# Patient Record
Sex: Female | Born: 1960 | Race: Black or African American | Hispanic: No | Marital: Single | Smoking: Never smoker
Health system: Southern US, Community
[De-identification: ages and names within clinical notes are randomized; demographics above are authoritative.]

## PROBLEM LIST (undated history)

## (undated) DIAGNOSIS — E288 Other ovarian dysfunction: Secondary | ICD-10-CM

## (undated) HISTORY — DX: Other ovarian dysfunction: E28.8

## (undated) HISTORY — PX: WISDOM TOOTH EXTRACTION: SHX21

---

## 2003-09-12 DIAGNOSIS — E2839 Other primary ovarian failure: Secondary | ICD-10-CM

## 2003-09-12 HISTORY — DX: Other primary ovarian failure: E28.39

## 2004-02-09 ENCOUNTER — Other Ambulatory Visit: Admission: RE | Admit: 2004-02-09 | Discharge: 2004-02-09 | Payer: Self-pay | Admitting: Obstetrics and Gynecology

## 2005-03-07 ENCOUNTER — Other Ambulatory Visit: Admission: RE | Admit: 2005-03-07 | Discharge: 2005-03-07 | Payer: Self-pay | Admitting: Obstetrics and Gynecology

## 2006-03-08 ENCOUNTER — Other Ambulatory Visit: Admission: RE | Admit: 2006-03-08 | Discharge: 2006-03-08 | Payer: Self-pay | Admitting: Obstetrics and Gynecology

## 2007-03-11 ENCOUNTER — Other Ambulatory Visit: Admission: RE | Admit: 2007-03-11 | Discharge: 2007-03-11 | Payer: Self-pay | Admitting: Obstetrics and Gynecology

## 2007-03-13 ENCOUNTER — Ambulatory Visit: Payer: Self-pay

## 2008-03-12 ENCOUNTER — Other Ambulatory Visit: Admission: RE | Admit: 2008-03-12 | Discharge: 2008-03-12 | Payer: Self-pay | Admitting: Obstetrics & Gynecology

## 2008-03-19 ENCOUNTER — Ambulatory Visit: Payer: Self-pay | Admitting: Obstetrics and Gynecology

## 2008-09-24 DIAGNOSIS — E010 Iodine-deficiency related diffuse (endemic) goiter: Secondary | ICD-10-CM | POA: Insufficient documentation

## 2008-09-24 DIAGNOSIS — E559 Vitamin D deficiency, unspecified: Secondary | ICD-10-CM | POA: Insufficient documentation

## 2008-09-24 DIAGNOSIS — I459 Conduction disorder, unspecified: Secondary | ICD-10-CM | POA: Insufficient documentation

## 2009-04-01 ENCOUNTER — Ambulatory Visit: Payer: Self-pay | Admitting: Obstetrics and Gynecology

## 2009-04-06 ENCOUNTER — Ambulatory Visit: Payer: Self-pay | Admitting: Obstetrics and Gynecology

## 2010-05-18 ENCOUNTER — Ambulatory Visit: Payer: Self-pay | Admitting: Obstetrics and Gynecology

## 2011-06-30 ENCOUNTER — Ambulatory Visit: Payer: Self-pay | Admitting: Obstetrics and Gynecology

## 2012-07-11 ENCOUNTER — Ambulatory Visit: Payer: Self-pay | Admitting: Obstetrics and Gynecology

## 2013-05-13 ENCOUNTER — Encounter: Payer: Self-pay | Admitting: Nurse Practitioner

## 2013-05-13 ENCOUNTER — Ambulatory Visit (INDEPENDENT_AMBULATORY_CARE_PROVIDER_SITE_OTHER): Payer: BC Managed Care – PPO | Admitting: Nurse Practitioner

## 2013-05-13 VITALS — BP 130/84 | HR 56 | Resp 16 | Ht 65.75 in | Wt 165.0 lb

## 2013-05-13 DIAGNOSIS — Z0000 Encounter for general adult medical examination without abnormal findings: Secondary | ICD-10-CM

## 2013-05-13 DIAGNOSIS — Z01419 Encounter for gynecological examination (general) (routine) without abnormal findings: Secondary | ICD-10-CM

## 2013-05-13 LAB — LIPID PANEL
Cholesterol: 173 mg/dL (ref 0–200)
HDL: 72 mg/dL (ref >39)
LDL Cholesterol: 92 mg/dL (ref 0–99)
Total CHOL/HDL Ratio: 2.4 Ratio
Triglycerides: 44 mg/dL (ref <150)
VLDL: 9 mg/dL (ref 0–40)

## 2013-05-13 LAB — POCT URINALYSIS DIPSTICK
Bilirubin, UA: NEGATIVE
Glucose, UA: NEGATIVE
Ketones, UA: NEGATIVE
Leukocytes, UA: NEGATIVE
Nitrite, UA: NEGATIVE
Protein, UA: NEGATIVE
Urobilinogen, UA: NEGATIVE
pH, UA: 7

## 2013-05-13 LAB — COMPREHENSIVE METABOLIC PANEL
ALT: 16 U/L (ref 0–35)
AST: 21 U/L (ref 0–37)
Albumin: 4.6 g/dL (ref 3.5–5.2)
Alkaline Phosphatase: 67 U/L (ref 39–117)
BUN: 18 mg/dL (ref 6–23)
CO2: 28 mEq/L (ref 19–32)
Calcium: 9.7 mg/dL (ref 8.4–10.5)
Chloride: 107 mEq/L (ref 96–112)
Creat: 0.84 mg/dL (ref 0.50–1.10)
Glucose, Bld: 86 mg/dL (ref 70–99)
Potassium: 4.5 mEq/L (ref 3.5–5.3)
Sodium: 143 mEq/L (ref 135–145)
Total Bilirubin: 0.5 mg/dL (ref 0.3–1.2)
Total Protein: 6.9 g/dL (ref 6.0–8.3)

## 2013-05-13 LAB — TSH: TSH: 0.97 u[IU]/mL (ref 0.350–4.500)

## 2013-05-13 LAB — HEMOGLOBIN, FINGERSTICK: Hemoglobin, fingerstick: 13.6 g/dL (ref 12.0–16.0)

## 2013-05-13 MED ORDER — ERGOCALCIFEROL 1.25 MG (50000 UT) PO CAPS: 50000.0000 [IU] | ORAL_CAPSULE | ORAL | Status: DC

## 2013-05-13 NOTE — Patient Instructions (Signed)

## 2013-05-13 NOTE — Progress Notes (Signed)
Patient ID: Emily Roth, female   DOB: 1961/02/01, 52 y.o.   MRN: [id number] 52 y.o. .G1, P1 Divorced African American Fe here for annual exam.   Occasional vaso symptoms. Not sexually active. No new health problems. She normally goes on Missions trip in the summer with the teens, she did not go this summer.  No LMP recorded. Patient is postmenopausal.          Sexually active: no  The current method of family planning is abstinence.    Exercising: yes  walking, running,weights Smoker:  no  Health Maintenance: Pap:  05/07/12, WNL, neg HR HPV MMG:  07/11/12, BI-Rads 2: benign findings Colonoscopy:  10/09/11 polyp recheck in 5 -10 years BMD: 04/01/09 T Score: spine -1.3/ left hip neck -0.5/ radius -0.7 TDaP:  05/09/13 Labs: HB: 13.6 Urine: trace blood, pH 7.0   reports that she has never smoked. She has never used smokeless tobacco. She reports that she does not drink alcohol or use illicit drugs.  Past Medical History  Diagnosis Date  . Premature ovarian failure     History reviewed. No pertinent past surgical history.  Current Outpatient Prescriptions  Medication Sig Dispense Refill  . CALCIUM PO Take by mouth.      . ergocalciferol (VITAMIN D2) 50000 UNITS capsule Take 50,000 Units by mouth every 30 (thirty) days.      . Multiple Vitamin (MULTIVITAMIN) tablet Take 1 tablet by mouth daily.      . Multiple Vitamins-Minerals (HAIR/SKIN/NAILS PO) Take by mouth.      . Omega-3 Fatty Acids (FISH OIL PO) Take by mouth.       No current facility-administered medications for this visit.    Family History  Problem Relation Age of Onset  . Other Sister     [place] Syndrome  . Other Brother     [place] Syndrome  . Other Brother     [place] Syndrome    ROS:  Pertinent items are noted in HPI.  Otherwise, a comprehensive ROS was negative.  Exam:   BP 130/84  Pulse 56  Resp 16  Ht 5' 5.75" (1.67 m)  Wt 165 lb (74.844 kg)  BMI 26.84 kg/m2 Height: 5' 5.75" (167 cm)  Ht  Readings from Last 3 Encounters:  05/13/13 5' 5.75" (1.67 m)    General appearance: alert, cooperative and appears stated age Head: Normocephalic, without obvious abnormality, atraumatic Neck: no adenopathy, supple, symmetrical, trachea midline and thyroid normal to inspection and palpation Lungs: clear to auscultation bilaterally Breasts: normal appearance, no masses or tenderness Heart: regular rate and rhythm Abdomen: soft, non-tender; no masses,  no organomegaly Extremities: extremities normal, atraumatic, no cyanosis or edema Skin: Skin color, texture, turgor normal. No rashes or lesions Lymph nodes: Cervical, supraclavicular, and axillary nodes normal. No abnormal inguinal nodes palpated Neurologic: Grossly normal   Pelvic: External genitalia:  no lesions              Urethra:  normal appearing urethra with no masses, tenderness or lesions              Bartholin's and Skene's: normal                 Vagina: normal appearing vagina with normal color and discharge, no lesions              Cervix: anteverted              Pap taken: no Bimanual Exam:  Uterus:  normal size, contour, position, consistency, mobility, non-tender              Adnexa: no mass, fullness, tenderness               Rectovaginal: Confirms               Anus:  normal sphincter tone, no lesions  A:  Well Woman with normal exam  History of POF age 55 - no HRT  Vit D deficiency  P:   Pap smear as per guidelines   Mammogram due after 07/12/13 note given for [facility]  Refill Vitamin D - instructions per lab results  Counseled on breast self exam, adequate intake of calcium and vitamin D, diet and exercise return annually or prn  An After Visit Summary was printed and given to the patient.

## 2013-05-14 LAB — VITAMIN D 25 HYDROXY (VIT D DEFICIENCY, FRACTURES): Vit D, 25-Hydroxy: 53 ng/mL (ref 30–89)

## 2013-05-17 NOTE — Progress Notes (Signed)
Encounter reviewed by Dr. Conley Simmonds.  Consider bone density this year or next year due to osteopenia and premature ovarian failure.

## 2013-05-27 ENCOUNTER — Other Ambulatory Visit: Payer: Self-pay | Admitting: Nurse Practitioner

## 2013-07-14 ENCOUNTER — Ambulatory Visit: Payer: Self-pay | Admitting: Obstetrics and Gynecology

## 2014-05-19 ENCOUNTER — Ambulatory Visit (INDEPENDENT_AMBULATORY_CARE_PROVIDER_SITE_OTHER): Payer: BC Managed Care – PPO | Admitting: Nurse Practitioner

## 2014-05-19 ENCOUNTER — Encounter: Payer: Self-pay | Admitting: Nurse Practitioner

## 2014-05-19 VITALS — BP 120/66 | HR 68 | Ht 66.0 in | Wt 162.0 lb

## 2014-05-19 DIAGNOSIS — E288 Other ovarian dysfunction: Secondary | ICD-10-CM

## 2014-05-19 DIAGNOSIS — Z01419 Encounter for gynecological examination (general) (routine) without abnormal findings: Secondary | ICD-10-CM

## 2014-05-19 DIAGNOSIS — N952 Postmenopausal atrophic vaginitis: Secondary | ICD-10-CM | POA: Insufficient documentation

## 2014-05-19 DIAGNOSIS — Z0000 Encounter for general adult medical examination without abnormal findings: Secondary | ICD-10-CM

## 2014-05-19 DIAGNOSIS — R319 Hematuria, unspecified: Secondary | ICD-10-CM

## 2014-05-19 DIAGNOSIS — E2839 Other primary ovarian failure: Secondary | ICD-10-CM | POA: Insufficient documentation

## 2014-05-19 LAB — POCT URINALYSIS DIPSTICK
Bilirubin, UA: NEGATIVE
Glucose, UA: NEGATIVE
Ketones, UA: NEGATIVE
Leukocytes, UA: NEGATIVE
Nitrite, UA: NEGATIVE
Protein, UA: NEGATIVE
Urobilinogen, UA: NEGATIVE
pH, UA: 6

## 2014-05-19 NOTE — Patient Instructions (Signed)

## 2014-05-19 NOTE — Progress Notes (Signed)
Patient ID: Emily Roth, female   DOB: 1961-06-01, 53 y.o.   MRN: [id number] 53 y.o. G1P1001 Divorced African American Fe here for annual exam.   Before going on [place] trip went to PCP and had UTI.  That was treated with Cipro and symptoms now gone.  Went on [place] trip this [place]  Patient's last menstrual period was 01/10/2004.          Sexually active: no  The current method of family planning is abstinence and post menopausal.  Exercising: yes treadmill and weights  Smoker: no   Health Maintenance:  Pap: 05/07/12, WNL, neg HR HPV  MMG: 07/14/13, Bi-Rads 1:  Negative  Colonoscopy: 10/09/11 polyp recheck in 5 -10 years  BMD: 04/01/09 T Score: spine -1.3/ left hip neck -0.5/ radius -0.7  TDaP: 05/09/13  Labs:  HB:  PCP  Urine:  Small RBC   reports that she has never smoked. She has never used smokeless tobacco. She reports that she does not drink alcohol or use illicit drugs.  Past Medical History  Diagnosis Date  . Premature ovarian failure 2005    age 45    Past Surgical History  Procedure Laterality Date  . Wisdom tooth extraction  age 89    Current Outpatient Prescriptions  Medication Sig Dispense Refill  . CALCIUM PO Take by mouth.      . ergocalciferol (VITAMIN D2) 50000 UNITS capsule Take 1 capsule (50,000 Units total) by mouth every 30 (thirty) days.  30 capsule  1  . Multiple Vitamin (MULTIVITAMIN) tablet Take 1 tablet by mouth daily.      . Multiple Vitamins-Minerals (HAIR/SKIN/NAILS PO) Take by mouth.      . Omega-3 Fatty Acids (FISH OIL PO) Take by mouth.       No current facility-administered medications for this visit.    Family History  Problem Relation Age of Onset  . Other Sister     [place] Syndrome  . Other Brother     [place] Syndrome  . Other Brother     [place] Syndrome    ROS:  Pertinent items are noted in HPI.  Otherwise, a comprehensive ROS was negative.  Exam:   BP 120/66  Pulse 68  Ht  (1.676 m)  Wt 162 lb  (73.483 kg)  BMI 26.16 kg/m2  LMP 01/10/2004 Height:  (167.6 cm)  Ht Readings from Last 3 Encounters:  05/19/14  (1.676 m)  05/13/13 5' 5.75" (1.67 m)    General appearance: alert, cooperative and appears stated age Head: Normocephalic, without obvious abnormality, atraumatic Neck: no adenopathy, supple, symmetrical, trachea midline and thyroid normal to inspection and palpation Lungs: clear to auscultation bilaterally Breasts: normal appearance, no masses or tenderness Heart: regular rate and rhythm Abdomen: soft, non-tender; no masses,  no organomegaly Extremities: extremities normal, atraumatic, no cyanosis or edema Skin: Skin color, texture, turgor normal. No rashes or lesions Lymph nodes: Cervical, supraclavicular, and axillary nodes normal. No abnormal inguinal nodes palpated Neurologic: Grossly normal   Pelvic: External genitalia:  no lesions              Urethra:  normal appearing urethra with no masses, tenderness or lesions              Bartholin's and Skene's: normal                 Vagina: atrophic appearing vagina with pale color and discharge, no lesions  Cervix: anteverted              Pap taken: No. Bimanual Exam:  Uterus:  normal size, contour, position, consistency, mobility, non-tender              Adnexa: no mass, fullness, tenderness               Rectovaginal: Confirms               Anus:  normal sphincter tone, no lesions  A:  Well Woman with normal exam  History of POF with menopause at age 50  History of recent UTI - today hematuria asymptomatic  Atrophic vaginitis  P:   Reviewed health and wellness pertinent to exam  Pap smear not taken today  Mammogram is due 11/15  Will follow with urine micro and C&S  Hand written note for BMD and Mammo at [facility] on breast self exam, mammography screening, osteoporosis, adequate intake of calcium and vitamin D, diet and exercise, Kegel's exercises return annually or  prn  An After Visit Summary was printed and given to the patient.

## 2014-05-20 LAB — URINE CULTURE: Colony Count: 9000

## 2014-05-20 LAB — URINALYSIS, MICROSCOPIC ONLY
Bacteria, UA: NONE SEEN
Casts: NONE SEEN
Crystals: NONE SEEN
Squamous Epithelial / LPF: NONE SEEN

## 2014-05-24 NOTE — Progress Notes (Signed)
Encounter reviewed by Dr. Brook Silva.  

## 2014-07-13 ENCOUNTER — Encounter: Payer: Self-pay | Admitting: Nurse Practitioner

## 2014-07-15 ENCOUNTER — Ambulatory Visit: Payer: Self-pay | Admitting: Obstetrics and Gynecology

## 2014-07-28 ENCOUNTER — Ambulatory Visit: Payer: Self-pay | Admitting: Obstetrics and Gynecology

## 2014-07-30 ENCOUNTER — Telehealth: Payer: Self-pay | Admitting: Nurse Practitioner

## 2014-07-30 NOTE — Telephone Encounter (Signed)
Let patient know that BMD shows  The T Score of spine at -1.6; left neck femur at -0.8.  The spine scores are in the osteopenic range, hip is normal.  Comparison study to 2010 shows a decrease at spine of -5%.  The FRAX score for major osteoporotic fracture is 10 years is 2.2% (goal is < 20%); for hip fracture 0.1% (goal is <3%).  While some bone loss is normal and expectant we do not want any further loss.  She must do walking exercise, upper body weights, calcium, and Vit D.  Recheck in 2 years.

## 2014-07-31 NOTE — Telephone Encounter (Signed)
I have attempted to contact this patient by phone with the following results: left message to return my call on answering machine.  [phone] mobile per DPR.

## 2014-08-20 NOTE — Telephone Encounter (Signed)
RC from patient.  Notified of results.  She is agreeable with plan.

## 2014-10-20 ENCOUNTER — Telehealth: Payer: Self-pay | Admitting: Nurse Practitioner

## 2014-10-20 NOTE — Telephone Encounter (Signed)
Left message to call back and reschedule appointment.

## 2014-12-24 ENCOUNTER — Telehealth: Payer: Self-pay | Admitting: Nurse Practitioner

## 2014-12-24 NOTE — Telephone Encounter (Signed)
Left message on voicemail to reschedule aex appointment. °

## 2015-01-04 ENCOUNTER — Telehealth: Payer: Self-pay | Admitting: Nurse Practitioner

## 2015-01-04 NOTE — Telephone Encounter (Signed)
Left patient a message to call back to reschedule a future appointment that was cancelled by the provider. °

## 2015-04-21 ENCOUNTER — Other Ambulatory Visit: Payer: Self-pay | Admitting: Nurse Practitioner

## 2015-04-21 NOTE — Telephone Encounter (Signed)
Medication refill request: Vitamin D 50,000 iu's Last AEX:  05/19/14 with PG  Next AEX: 05/21/15 with PG  Last Vitamin D level checked: 05/13/13 at 53 Refill authorized: Please advise

## 2015-05-20 ENCOUNTER — Ambulatory Visit: Payer: Self-pay | Admitting: Nurse Practitioner

## 2015-05-21 ENCOUNTER — Ambulatory Visit (INDEPENDENT_AMBULATORY_CARE_PROVIDER_SITE_OTHER): Payer: BLUE CROSS/BLUE SHIELD | Admitting: Nurse Practitioner

## 2015-05-21 ENCOUNTER — Encounter: Payer: Self-pay | Admitting: Nurse Practitioner

## 2015-05-21 VITALS — BP 120/84 | HR 72 | Ht 65.75 in | Wt 165.0 lb

## 2015-05-21 DIAGNOSIS — E288 Other ovarian dysfunction: Secondary | ICD-10-CM | POA: Diagnosis not present

## 2015-05-21 DIAGNOSIS — Z0000 Encounter for general adult medical examination without abnormal findings: Secondary | ICD-10-CM | POA: Diagnosis not present

## 2015-05-21 DIAGNOSIS — N952 Postmenopausal atrophic vaginitis: Secondary | ICD-10-CM | POA: Diagnosis not present

## 2015-05-21 DIAGNOSIS — E2839 Other primary ovarian failure: Secondary | ICD-10-CM

## 2015-05-21 DIAGNOSIS — Z01419 Encounter for gynecological examination (general) (routine) without abnormal findings: Secondary | ICD-10-CM | POA: Diagnosis not present

## 2015-05-21 LAB — POCT URINALYSIS DIPSTICK
Bilirubin, UA: NEGATIVE
Glucose, UA: NEGATIVE
Ketones, UA: NEGATIVE
Nitrite, UA: NEGATIVE
Protein, UA: NEGATIVE
Urobilinogen, UA: NEGATIVE
pH, UA: 6

## 2015-05-21 NOTE — Progress Notes (Signed)
Patient ID: Emily Roth, female   DOB: 1961-05-02, 54 y.o.   MRN: [id number] 54 y.o. G1P1001 Married  African American Fe here for annual exam.  Usual vaso symptoms that are tolerable.  She has lost 3 other family members from Birdsong disease.  Niece age 7 and nephew age 83.  Later a MU with same plus suspect another disease maybe cancer.   Patient's last menstrual period was 01/10/2004 (approximate).          Sexually active: No.  The current method of family planning is none.    Exercising: Yes.    weights, treadmill and dance Smoker:  no  Health Maintenance: Pap: 05/07/12, Negative, neg HR HPV  MMG: 07/15/14, Bi-Rads 1: Negative Colonoscopy: 10/09/11 polyp recheck in 5 -10 years  BMD: 07/15/14 T Score: spine -1.6 / left hip neck -0.8 TDaP: 05/09/13  Labs: HB: 13.8  Urine:  Trace leuk's, small RBC   reports that she has never smoked. She has never used smokeless tobacco. She reports that she does not drink alcohol or use illicit drugs.  Past Medical History  Diagnosis Date  . Premature ovarian failure 2005    age 89    Past Surgical History  Procedure Laterality Date  . Wisdom tooth extraction  age 54    Current Outpatient Prescriptions  Medication Sig Dispense Refill  . CALCIUM PO Take by mouth.    . Multiple Vitamin (MULTIVITAMIN) tablet Take 1 tablet by mouth daily.    . Multiple Vitamins-Minerals (HAIR/SKIN/NAILS PO) Take by mouth.    . Omega-3 Fatty Acids (FISH OIL PO) Take by mouth.    . Vitamin D, Ergocalciferol, (DRISDOL) 50000 UNITS CAPS capsule TAKE ONE CAPSULE BY MOUTH ONCE PER MONTH 4 capsule 0   No current facility-administered medications for this visit.    Family History  Problem Relation Age of Onset  . Other Sister     [place] Syndrome  . Other Brother     [place] Syndrome  . Other Brother     [place] Syndrome  . Other Other     neice - [place]  . Other Other     nephew - [place]  . Other Maternal Uncle     ? cnacer and [place]    ROS:  Pertinent items are noted in HPI.  Otherwise, a comprehensive ROS was negative.  Exam:   BP 120/84 mmHg  Pulse 72  Ht 5' 5.75" (1.67 m)  Wt 165 lb (74.844 kg)  BMI 26.84 kg/m2  LMP 01/10/2004 (Approximate) Height: 5' 5.75" (167 cm) Ht Readings from Last 3 Encounters:  05/21/15 5' 5.75" (1.67 m)  05/19/14  (1.676 m)  05/13/13 5' 5.75" (1.67 m)    General appearance: alert, cooperative and appears stated age Head: Normocephalic, without obvious abnormality, atraumatic Neck: no adenopathy, supple, symmetrical, trachea midline and thyroid normal to inspection and palpation Lungs: clear to auscultation bilaterally Breasts: normal appearance, no masses or tenderness Heart: regular rate and rhythm Abdomen: soft, non-tender; no masses,  no organomegaly Extremities: extremities normal, atraumatic, no cyanosis or edema Skin: Skin color, texture, turgor normal. No rashes or lesions Lymph nodes: Cervical, supraclavicular, and axillary nodes normal. No abnormal inguinal nodes palpated Neurologic: Grossly normal   Pelvic: External genitalia:  no lesions              Urethra:  normal appearing urethra with no masses, tenderness or lesions  Bartholin's and Skene's: normal                 Vagina: normal appearing vagina with normal color and discharge, no lesions              Cervix: anteverted              Pap taken: Yes.   Bimanual Exam:  Uterus:  normal size, contour, position, consistency, mobility, non-tender              Adnexa: no mass, fullness, tenderness               Rectovaginal: Confirms               Anus:  normal sphincter tone, no lesions  Chaperone present: yes  A:  Well Woman with normal exam  History of POF with menopause at age 89 History of recent UTI - today hematuria asymptomatic Atrophic vaginitis  Family history of [place] Disease  P:   Reviewed health and wellness pertinent to exam  Pap smear as  above  Mammogram is due 11/15  Counseled on breast self exam, mammography screening, adequate intake of calcium and vitamin D, diet and exercise return annually or prn  An After Visit Summary was printed and given to the patient.

## 2015-05-21 NOTE — Progress Notes (Signed)
Encounter reviewed by Dr. Brook Amundson C. Silva.  

## 2015-05-21 NOTE — Patient Instructions (Signed)

## 2015-05-24 LAB — HEMOGLOBIN, FINGERSTICK: Hemoglobin, fingerstick: 13.8 g/dL (ref 12.0–16.0)

## 2015-05-25 ENCOUNTER — Ambulatory Visit: Payer: BC Managed Care – PPO | Admitting: Nurse Practitioner

## 2015-05-25 ENCOUNTER — Ambulatory Visit: Payer: Self-pay | Admitting: Nurse Practitioner

## 2015-05-25 LAB — IPS PAP TEST WITH HPV

## 2015-07-22 ENCOUNTER — Ambulatory Visit (INDEPENDENT_AMBULATORY_CARE_PROVIDER_SITE_OTHER): Payer: BLUE CROSS/BLUE SHIELD | Admitting: Physician Assistant

## 2015-07-22 ENCOUNTER — Encounter: Payer: Self-pay | Admitting: Physician Assistant

## 2015-07-22 VITALS — BP 132/68 | HR 70 | Temp 97.8°F | Resp 16 | Ht 65.75 in | Wt 169.6 lb

## 2015-07-22 DIAGNOSIS — Z1239 Encounter for other screening for malignant neoplasm of breast: Secondary | ICD-10-CM | POA: Diagnosis not present

## 2015-07-22 DIAGNOSIS — Z7189 Other specified counseling: Secondary | ICD-10-CM | POA: Diagnosis not present

## 2015-07-22 DIAGNOSIS — Z7184 Encounter for health counseling related to travel: Secondary | ICD-10-CM

## 2015-07-22 DIAGNOSIS — J309 Allergic rhinitis, unspecified: Secondary | ICD-10-CM | POA: Insufficient documentation

## 2015-07-22 DIAGNOSIS — F4320 Adjustment disorder, unspecified: Secondary | ICD-10-CM | POA: Insufficient documentation

## 2015-07-22 NOTE — Progress Notes (Signed)
Patient: Emily Roth, Female    DOB: 1960-12-25, 54 y.o.   MRN: [id number] Visit Date: 07/22/2015  Today's Provider: Margaretann Loveless, PA-C   Chief Complaint  Patient presents with  . Annual Exam   Subjective:    Annual physical exam Emily Roth is a 54 y.o. female who presents today for health maintenance and complete physical. She feels well. She reports exercising,walks and weight lifting sometimes. She reports she is sleeping fairly well. She recently had her Pap smear, pelvic exam and breast exam done at [facility] by Ria Comment, FNP. She did not get a prescription for her screening mammogram which she is normally given. She comes today to get that prescription for her mammogram to be done at [facility] breast clinic. She has never had an abnormal mammogram. There is no family history of breast cancer.  She does have a strong family history of [place] disease. This is a genetic disease that causes neuromuscular dysfunction similar to Huntington's. She is interested in learning more information about this.  She also will be traveling to [place] in March 2017. In 2015 she did a [place] trip [place] and did receive a lot of vaccinations at that time. With this new missions trip she is in need of her yellow fever vaccine. She is also requesting educations for malaria prophylaxis with doxycycline as well as ciprofloxacin for prophylaxis of traveler's diarrhea. Patient declined influenza vaccine.  Last PCP: 12/10/13   Review of Systems  Constitutional: Negative.   HENT: Negative.   Eyes: Negative.   Respiratory: Negative.   Cardiovascular: Negative.   Gastrointestinal: Negative.   Endocrine: Negative.   Genitourinary: Negative.   Musculoskeletal: Negative.   Skin: Negative.   Allergic/Immunologic: Negative.   Neurological: Negative.   Hematological: Negative.   Psychiatric/Behavioral: Negative.     Social History She  reports that  she has never smoked. She has never used smokeless tobacco. She reports that she does not drink alcohol or use illicit drugs. Social History   Social History  . Marital Status: Divorced    Spouse Name: N/A  . Number of Children: 1  . Years of Education: N/A   Social History Main Topics  . Smoking status: Never Smoker   . Smokeless tobacco: Never Used  . Alcohol Use: No     Comment: rare  . Drug Use: No  . Sexual Activity:    Partners: Male    Birth Control/ Protection: Post-menopausal   Other Topics Concern  . None   Social History Narrative    Patient Active Problem List   Diagnosis Date Noted  . 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 Allergic rhinitis 07/22/2015  . 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 Premature ovarian failure 05/19/2014  . Cardiac conduction disorder 09/24/2008  . 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 Avitaminosis D 09/24/2008    Past Surgical History  Procedure Laterality Date  . Wisdom tooth extraction  age 27    Family History  Family Status  Relation Status Death Age  . Sister Deceased late 33    [place] Syndrome  . Brother Deceased 22    [place] Syndrome  . Brother Deceased late 21    [place] Syndrome  . Mother Alive   . Father Deceased     complications of pneumonia and CVA  . Maternal Aunt Deceased   . Maternal Uncle Deceased 68    [place] Syndrome  & maybe cancer  .  Other Deceased 47    [place] Syndrome  . Other Deceased 5    [place] Syndrome   Her family history includes Other in her brother, brother, maternal uncle, other, other, and sister.    No Known Allergies  Previous Medications   ASPIRIN 81 MG TABLET       CALCIUM PO    Take by mouth.   MULTIPLE VITAMIN (MULTIVITAMIN) TABLET    Take 1 tablet by mouth daily.   MULTIPLE VITAMINS-MINERALS (HAIR/SKIN/NAILS PO)    Take by mouth.   OMEGA-3 FATTY ACIDS (FISH OIL PO)    Take by mouth.   VITAMIN D, ERGOCALCIFEROL, (DRISDOL) 50000 UNITS CAPS CAPSULE    TAKE ONE  CAPSULE BY MOUTH ONCE PER MONTH    Patient Care Team: Margaretann Loveless, PA-C as PCP - General (Family Medicine)     Objective:   Vitals: BP 132/68 mmHg  Pulse 70  Temp(Src) 97.8 F (36.6 C) (Oral)  Resp 16  Ht 5' 5.75" (1.67 m)  Wt 169 lb 9.6 oz (76.93 kg)  BMI 27.58 kg/m2  LMP 01/10/2004 (Approximate)   Physical Exam  Constitutional: She appears well-developed and well-nourished. No distress.  HENT:  Head: Normocephalic and atraumatic.  Right Ear: Tympanic membrane and external ear normal.  Left Ear: Tympanic membrane and external ear normal.  Nose: Nose normal.  Mouth/Throat: Oropharynx is clear and moist. No oropharyngeal exudate.  Neck: Normal range of motion. Neck supple. No JVD present. No tracheal deviation present. No thyromegaly present.  Cardiovascular: Normal rate, regular rhythm and normal heart sounds.  Exam reveals no gallop and no friction rub.   No murmur heard. Pulmonary/Chest: Effort normal and breath sounds normal. No respiratory distress. She has no wheezes. She has no rales.  Lymphadenopathy:    She has no cervical adenopathy.  Skin: She is not diaphoretic.  Psychiatric: She has a normal mood and affect. Her behavior is normal. Judgment and thought content normal.  Vitals reviewed.    Depression Screen PHQ 2/9 Scores 07/22/2015  PHQ - 2 Score 0      Assessment & Plan:     Routine Health Maintenance and Physical Exam  1. Travel advice encounter She will be traveling to [place] in March 2017. She is in need of a vaccination against yellow fever. All other vaccinations are up-to-date. I did advise her to go to the health department for this vaccination. Gave her a copy of her current immunizations. She is to call the office when it gets closer to the trip or prophylactic medications of doxycycline for malaria and ciprofloxacin for traveler's diarrhea. She may call the office if she has any questions or concerns in the meantime.  2.  Breast cancer screening Breast exam was done in [facility] women[facility] by Ria Comment, FNP. Order for mammogram was placed. Information for [facility] breast clinic was given to patient so that she may call and schedule her mammogram at her discretion. - MM Digital Screening; Future   Exercise Activities and Dietary recommendations Goals    None      Immunization History  Administered Date(s) Administered  . Hepatitis B 09/11/2013, 10/12/2013, 03/11/2014  . Tdap 05/09/2013  . Typhoid Inactivated 03/11/2014    Health Maintenance  Topic Date Due  . Hepatitis C Screening  10-Jul-1961  . HIV Screening  06/20/1976  . COLONOSCOPY  06/21/2011  . INFLUENZA VACCINE  04/12/2015  . MAMMOGRAM  07/15/2016  . PAP SMEAR  05/20/2018  . TETANUS/TDAP  05/10/2023  Discussed health benefits of physical activity, and encouraged her to engage in regular exercise appropriate for her age and condition.    --------------------------------------------------------------------

## 2015-07-22 NOTE — Patient Instructions (Signed)
Immunization Information for Foreign Travel Immunizations can protect you from certain diseases. Immunizations can also prevent the spread of certain infections. It is important to see your caregiver or a travel medicine specialist 4-6 weeks before you travel. This allows time for vaccines to take effect. It also provides enough time for you to get vaccines that must be given in a series over a period of days or weeks. Immunizations for travelers include:  Routine vaccines. These vaccines are standard for the people in a country.  Recommended vaccines. These vaccines are recommended before travel to some countries or regions.  Required vaccines. These vaccines are necessary before travel to specific countries or regions. If it is less than 4 weeks before you leave, you should still see your caregiver. You might still benefit from vaccines or medicines. WHAT ARE THE ROUTINE VACCINES? Routine vaccines can protect you from diseases that are common in many parts of the world. Most routine vaccines are given at specific ages during your life. However, routine vaccines also include the annual flu (influenza) vaccine. You should be up to date on your routine immunizations before you travel. Your caregiver will be able to review your vaccine history and determine whether you have had all the routine vaccines. You may be advised to get extra doses or booster vaccines even if you are up to date on the routine vaccines. WHAT ARE THE RECOMMENDED VACCINES? Know your travel schedule when you visit your caregiver. The vaccines recommended before foreign travel will depend on several factors, including:  The country or countries of travel.  Whether you will travel to rural areas.  The length of time you will be traveling.  The season of the year.  Your age.  Your health status.  Your previous immunizations. Vaccine recommendations change over time. Your caregiver can tell you what vaccines are recommended  before your trip. The annual influenza vaccine sometimes differs for the northern and southern hemispheres. Unless the annual vaccines are the same in both hemispheres, people with certain chronic medical conditions who are traveling to the other hemisphere shortly before or during the influenza season should also get the other influenza vaccine. The other influenza vaccine should be obtained either before leaving the country or shortly after arrival at the travel site. WHAT ARE THE REQUIRED VACCINES? Vaccines may be required during a current outbreak of an infectious disease in a country or region. Your caregiver will be able to tell you about any current outbreaks and required vaccines. For example, proof of yellow fever immunization is currently required for most people before traveling to certain countries in [place] and [place]. This vaccine can only be obtained at approved centers. You should get the yellow fever vaccine at least 10 days before your trip. After 10 days, most people show immunity to yellow fever. If it has been longer than 10 years since you received the yellow fever vaccine, another dose is required. If proof of immunization is incomplete or inaccurate, you could be quarantined, denied entry, or given another dose of vaccine at the travel site. If you cannot receive the yellow fever vaccine because of medical reasons, you must have a written statement from your caregiver. The statement must contain a medical reason for the lack of immunization. In such a case, your caregiver should then give you advice on how to decrease your chance of getting yellow fever. That advice should include taking precautions to avoid mosquito bites and limiting outdoor time. Other than having a medical condition   or being under the age of 6 months, no other reasons will be accepted for not getting the vaccine.  Proof of meningococcal immunization is required by the [place] [employer] for any  person older than 2 years who is taking part in the hajj or umrah. Visas for traveling to the hajj or umrah will not even be issued until there is proof of immunization. You should get this vaccine at least 10 days before your trip. After 10 days, most people show immunity. If it has been longer than 3 years since your last immunization, another dose is required. FOR MORE INFORMATION  Centers for Disease Control and Prevention (CDC): www.cdc.gov  World Health Organization (WHO): www.who.int   This information is not intended to replace advice given to you by your health care provider. Make sure you discuss any questions you have with your health care provider.   Document Released: 08/16/2009 Document Revised: 09/18/2014 Document Reviewed: 07/26/2012 Elsevier Interactive Patient Education 2016 Elsevier Inc.  

## 2015-08-17 ENCOUNTER — Ambulatory Visit
Admission: RE | Admit: 2015-08-17 | Discharge: 2015-08-17 | Disposition: A | Discharge status: Home / Self Care | Payer: BLUE CROSS/BLUE SHIELD | Source: Ambulatory Visit | Attending: Physician Assistant | Admitting: Physician Assistant

## 2015-08-17 DIAGNOSIS — Z1231 Encounter for screening mammogram for malignant neoplasm of breast: Secondary | ICD-10-CM | POA: Diagnosis not present

## 2015-08-17 DIAGNOSIS — Z1239 Encounter for other screening for malignant neoplasm of breast: Secondary | ICD-10-CM

## 2015-08-18 ENCOUNTER — Telehealth: Payer: Self-pay

## 2015-08-18 NOTE — Telephone Encounter (Signed)
Left message for patient to return call.  Thanks,  -Jennavieve Arrick

## 2015-08-18 NOTE — Telephone Encounter (Signed)
-----   Message from Margaretann LovelessJennifer M Burnette, PA-C sent at 08/18/2015  9:22 AM EST ----- Normal mammogram. Repeat screening in one year.

## 2015-08-20 NOTE — Telephone Encounter (Signed)
No answer/unable to LM  Thanks,  -Joseline 

## 2015-08-23 NOTE — Telephone Encounter (Signed)
No answer unable to LM at the home #(Preffered) and LM at cell #.  Thanks,  -Joseline

## 2015-08-24 NOTE — Telephone Encounter (Signed)
Patient advised as directed below.  Thanks,  -Joseline 

## 2015-10-27 ENCOUNTER — Telehealth: Payer: Self-pay | Admitting: Physician Assistant

## 2015-10-27 DIAGNOSIS — A09 Infectious gastroenteritis and colitis, unspecified: Secondary | ICD-10-CM

## 2015-10-27 DIAGNOSIS — Z116 Encounter for screening for other protozoal diseases and helminthiases: Secondary | ICD-10-CM

## 2015-10-27 NOTE — Telephone Encounter (Signed)
Please advise.  Thanks,  -Roen Macgowan 

## 2015-10-27 NOTE — Telephone Encounter (Signed)
Pt stated that when she had her OV November 2016 Antony Contras was going to send in RX b/c she is going out of the country. I asked pt what medications she stated she didn't know something people that go out of the country get. Pt stated like something for diarrhea and Antony Contras would know what she needed. Pt would like the scripts sent to [facility] [address]. Please advise. Thanks TNP

## 2015-10-27 NOTE — Telephone Encounter (Signed)
She will need Cipro but can we see how long she is going to be gone so I know how much to give her. Thanks.

## 2015-10-28 MED ORDER — ATOVAQUONE-PROGUANIL HCL 250-100 MG PO TABS: 1.0000 | ORAL_TABLET | Freq: Every day | ORAL | Status: DC

## 2015-10-28 MED ORDER — CIPROFLOXACIN HCL 500 MG PO TABS: 500.0000 mg | ORAL_TABLET | Freq: Every day | ORAL | Status: DC

## 2015-10-28 NOTE — Telephone Encounter (Signed)
Meds sent to pharmacy.

## 2015-10-28 NOTE — Telephone Encounter (Signed)
Antony Contras spoke with patient and patient needs Travelers diarrhea and Med for Malaria.  Thanks,  -Ty Buntrock

## 2015-10-28 NOTE — Telephone Encounter (Signed)
LMTCB  Thanks,  -Emily Roth 

## 2016-05-24 ENCOUNTER — Encounter: Payer: Self-pay | Admitting: Nurse Practitioner

## 2016-05-24 ENCOUNTER — Ambulatory Visit (INDEPENDENT_AMBULATORY_CARE_PROVIDER_SITE_OTHER): Payer: BLUE CROSS/BLUE SHIELD | Admitting: Nurse Practitioner

## 2016-05-24 VITALS — BP 124/88 | HR 64 | Ht 65.5 in | Wt 166.0 lb

## 2016-05-24 DIAGNOSIS — E559 Vitamin D deficiency, unspecified: Secondary | ICD-10-CM | POA: Diagnosis not present

## 2016-05-24 DIAGNOSIS — Z8489 Family history of other specified conditions: Secondary | ICD-10-CM

## 2016-05-24 DIAGNOSIS — Z0000 Encounter for general adult medical examination without abnormal findings: Secondary | ICD-10-CM

## 2016-05-24 DIAGNOSIS — Z01419 Encounter for gynecological examination (general) (routine) without abnormal findings: Secondary | ICD-10-CM | POA: Diagnosis not present

## 2016-05-24 DIAGNOSIS — E288 Other ovarian dysfunction: Secondary | ICD-10-CM | POA: Diagnosis not present

## 2016-05-24 DIAGNOSIS — E2839 Other primary ovarian failure: Secondary | ICD-10-CM

## 2016-05-24 DIAGNOSIS — N952 Postmenopausal atrophic vaginitis: Secondary | ICD-10-CM | POA: Diagnosis not present

## 2016-05-24 LAB — POCT URINALYSIS DIPSTICK
Bilirubin, UA: NEGATIVE
Blood, UA: NEGATIVE
Glucose, UA: NEGATIVE
Ketones, UA: NEGATIVE
Leukocytes, UA: NEGATIVE
Nitrite, UA: NEGATIVE
Protein, UA: NEGATIVE
Urobilinogen, UA: NEGATIVE
pH, UA: 6.5

## 2016-05-24 MED ORDER — VITAMIN D (ERGOCALCIFEROL) 1.25 MG (50000 UNIT) PO CAPS: ORAL_CAPSULE | ORAL | 3 refills | Status: DC

## 2016-05-24 NOTE — Progress Notes (Signed)
Patient ID: Emily Roth, female   DOB: 1961/08/23, 55 y.o.   MRN: [id number]  55 y.o. G1P1001 Divorced  African American Fe here for annual exam.  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  Nephew [facility] passed in March at age about 6022.  She feels well.  Not dating or SA.  She continues to work in Engineering geologistretail.  She does have a part time job at [employer] but thinking of quitting because if she is put in the kitchen has a lot of respiratory problems.  Patient's last menstrual period was 01/10/2004 (approximate).          Sexually active: No.  The current method of family planning is none.    Exercising: Yes.    Gym/ health club routine includes HIT. Smoker:  no  Health Maintenance: Pap: 05/21/15, Negative with neg HR HPV  MMG: 08/17/15, Bi-Rads 1: Negative Colonoscopy: 10/09/11 polyp recheck in 5 -10 years  BMD: 07/15/14 T Score: -1.6 Spine / -0.8 Left Femur Neck TDaP: 05/09/13  Hep C and HIV: done today Labs: HB: 14.2  Urine: Negative    reports that she has never smoked. She has never used smokeless tobacco. She reports that she does not drink alcohol or use drugs.  Past Medical History:  Diagnosis Date  . Premature ovarian failure 2005   age 55    Past Surgical History:  Procedure Laterality Date  . WISDOM TOOTH EXTRACTION  age 55    Current Outpatient Prescriptions  Medication Sig Dispense Refill  . aspirin 81 MG tablet     . CALCIUM PO Take by mouth.    . Multiple Vitamin (MULTIVITAMIN) tablet Take 1 tablet by mouth daily.    . Multiple Vitamins-Minerals (HAIR/SKIN/NAILS PO) Take by mouth.    . Omega-3 Fatty Acids (FISH OIL PO) Take by mouth.    . Vitamin D, Ergocalciferol, (DRISDOL) 50000 units CAPS capsule TAKE ONE CAPSULE BY MOUTH ONCE PER MONTH 30 capsule 3   No current facility-administered medications for this visit.     Family History  Problem Relation Age of Onset  . Other Sister     [place] Syndrome  . Other  Brother     [place] Syndrome  . Other Brother     [place] Syndrome  . Other Other     [place] Syndrome  . Other Other     [place] Syndrome  . Other Other     nephew [place]  . Cancer Maternal Uncle     ?  Marland Kitchen. Breast cancer Neg Hx     ROS:  Pertinent items are noted in HPI.  Otherwise, a comprehensive ROS was negative.  Exam:   BP 124/88 (BP Location: Left Arm, Cuff Size: Normal)   Pulse 64   Ht 5' 5.5" (1.664 m)   Wt 166 lb (75.3 kg)   LMP 01/10/2004 (Approximate)   BMI 27.20 kg/m  Height: 5' 5.5" (166.4 cm) Ht Readings from Last 3 Encounters:  05/24/16 5' 5.5" (1.664 m)  07/22/15 5' 5.75" (1.67 m)  05/21/15 5' 5.75" (1.67 m)    General appearance: alert, cooperative and appears stated age Head: Normocephalic, without obvious abnormality, atraumatic Neck: no adenopathy, supple, symmetrical, trachea midline and thyroid normal to inspection and palpation Lungs: clear to auscultation bilaterally Breasts: normal appearance, no masses or tenderness Heart: regular rate and rhythm Abdomen: soft, non-tender; no masses,  no organomegaly Extremities: extremities  normal, atraumatic, no cyanosis or edema Skin: Skin color, texture, turgor normal. No rashes or lesions Lymph nodes: Cervical, supraclavicular, and axillary nodes normal. No abnormal inguinal nodes palpated Neurologic: Grossly normal   Pelvic: External genitalia:  no lesions              Urethra:  normal appearing urethra with no masses, tenderness or lesions              Bartholin's and Skene's: normal                 Vagina: very atrophic appearing vagina with normal color and discharge, no lesions              Cervix: anteverted              Pap taken: No. Bimanual Exam:  Uterus:  normal size, contour, position, consistency, mobility, non-tender              Adnexa: no mass, fullness, tenderness               Rectovaginal: Confirms               Anus:  normal sphincter tone, no lesions  Chaperone  present: yes  A:  Well Woman with normal exam  History of POF with menopause at age 56  Atrophic vaginitis - declines treatment  History of Vit D deficiency  Family history of [place] Disease   P:   Reviewed health and wellness pertinent to exam  Pap smear not done  Mammogram is due 12/17 and note is given to get done in [place]  Will follow with labs  Counseled on breast self exam, mammography screening, adequate intake of calcium and vitamin D, diet and exercise, Kegel's exercises return annually or prn  An After Visit Summary was printed and given to the patient.

## 2016-05-24 NOTE — Patient Instructions (Signed)

## 2016-05-25 LAB — VITAMIN D 25 HYDROXY (VIT D DEFICIENCY, FRACTURES): Vit D, 25-Hydroxy: 42 ng/mL (ref 30–100)

## 2016-05-25 LAB — HEPATITIS C ANTIBODY: HCV Ab: NEGATIVE

## 2016-05-25 LAB — HIV ANTIBODY (ROUTINE TESTING W REFLEX): HIV 1&2 Ab, 4th Generation: NONREACTIVE

## 2016-05-25 LAB — HEMOGLOBIN, FINGERSTICK: Hemoglobin, fingerstick: 14.2 g/dL (ref 12.0–16.0)

## 2016-05-28 NOTE — Progress Notes (Signed)
Encounter reviewed by Dr. Brook Amundson C. Silva.  

## 2016-07-20 ENCOUNTER — Other Ambulatory Visit: Payer: Self-pay | Admitting: Nurse Practitioner

## 2016-07-20 DIAGNOSIS — Z1231 Encounter for screening mammogram for malignant neoplasm of breast: Secondary | ICD-10-CM

## 2016-08-25 ENCOUNTER — Ambulatory Visit
Admission: RE | Admit: 2016-08-25 | Discharge: 2016-08-25 | Disposition: A | Discharge status: Home / Self Care | Payer: BLUE CROSS/BLUE SHIELD | Source: Ambulatory Visit | Attending: Nurse Practitioner | Admitting: Nurse Practitioner

## 2016-08-25 DIAGNOSIS — Z1231 Encounter for screening mammogram for malignant neoplasm of breast: Secondary | ICD-10-CM | POA: Diagnosis not present

## 2017-03-26 ENCOUNTER — Telehealth: Payer: Self-pay | Admitting: Obstetrics and Gynecology

## 2017-03-26 NOTE — Telephone Encounter (Signed)
Spoke with patient regarding upcoming appointment has been canceled and needs to be rescheduled. Patient to call later to reschedule.

## 2017-05-25 ENCOUNTER — Encounter: Payer: BLUE CROSS/BLUE SHIELD | Admitting: Physician Assistant

## 2017-05-28 ENCOUNTER — Ambulatory Visit: Payer: BLUE CROSS/BLUE SHIELD | Admitting: Nurse Practitioner

## 2017-06-20 ENCOUNTER — Encounter: Payer: BLUE CROSS/BLUE SHIELD | Admitting: Physician Assistant

## 2017-07-03 ENCOUNTER — Encounter: Payer: Self-pay | Admitting: Physician Assistant

## 2017-07-03 ENCOUNTER — Ambulatory Visit (INDEPENDENT_AMBULATORY_CARE_PROVIDER_SITE_OTHER): Payer: BLUE CROSS/BLUE SHIELD | Admitting: Physician Assistant

## 2017-07-03 VITALS — BP 130/88 | HR 64 | Temp 98.2°F | Resp 16 | Ht 66.0 in | Wt 177.0 lb

## 2017-07-03 DIAGNOSIS — Z1211 Encounter for screening for malignant neoplasm of colon: Secondary | ICD-10-CM

## 2017-07-03 DIAGNOSIS — E010 Iodine-deficiency related diffuse (endemic) goiter: Secondary | ICD-10-CM | POA: Diagnosis not present

## 2017-07-03 DIAGNOSIS — Z1231 Encounter for screening mammogram for malignant neoplasm of breast: Secondary | ICD-10-CM

## 2017-07-03 DIAGNOSIS — Z0000 Encounter for general adult medical examination without abnormal findings: Secondary | ICD-10-CM | POA: Diagnosis not present

## 2017-07-03 DIAGNOSIS — Z1239 Encounter for other screening for malignant neoplasm of breast: Secondary | ICD-10-CM

## 2017-07-03 DIAGNOSIS — Z13220 Encounter for screening for lipoid disorders: Secondary | ICD-10-CM

## 2017-07-03 DIAGNOSIS — E559 Vitamin D deficiency, unspecified: Secondary | ICD-10-CM | POA: Diagnosis not present

## 2017-07-03 DIAGNOSIS — Z136 Encounter for screening for cardiovascular disorders: Secondary | ICD-10-CM

## 2017-07-03 DIAGNOSIS — Z860100 Personal history of colon polyps, unspecified: Secondary | ICD-10-CM

## 2017-07-03 DIAGNOSIS — Z8481 Family history of carrier of genetic disease: Secondary | ICD-10-CM | POA: Insufficient documentation

## 2017-07-03 NOTE — Patient Instructions (Signed)

## 2017-07-03 NOTE — Progress Notes (Signed)
Patient: Emily Roth, Female    DOB: 03-13-1961, 56 y.o.   MRN: [id number] Visit Date: 07/03/2017  Today's Provider: Margaretann Loveless, PA-C   Chief Complaint  Patient presents with  . Annual Exam   Subjective:    Annual physical exam Emily Roth is a 56 y.o. female who presents today for health maintenance and complete physical. She feels well. She reports exercising active with daily activities. She reports she is sleeping well.  05/21/15 Pap-neg;HPV-neg 08/25/16 Mammogram-BI-RADS 1 10/09/11 Colonoscopy-polyps, recheck 5-10 years; no report  Patient reports she had flu vaccine with [facility] in [place]. -----------------------------------------------------------------   Review of Systems  Constitutional: Positive for activity change.  HENT: Negative.   Eyes: Negative.   Respiratory: Negative.   Cardiovascular: Negative.   Gastrointestinal: Negative.   Endocrine: Negative.   Genitourinary: Negative.   Musculoskeletal: Negative.   Skin: Negative.   Allergic/Immunologic: Positive for environmental allergies.  Neurological: Negative.   Hematological: Negative.   Psychiatric/Behavioral: Negative.     Social History      She  reports that she has never smoked. She has never used smokeless tobacco. She reports that she does not drink alcohol or use drugs.       Social History   Social History  . Marital status: Single    Spouse name: N/A  . Number of children: 1  . Years of education: N/A   Social History Main Topics  . Smoking status: Never Smoker  . Smokeless tobacco: Never Used  . Alcohol use No     Comment: rare  . Drug use: No  . Sexual activity: Not Currently    Partners: Male    Birth control/ protection: Post-menopausal   Other Topics Concern  . None   Social History Narrative   She works in Engineering geologist.  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    Past Medical  History:  Diagnosis Date  . Premature ovarian failure 2005   age 37     Patient Active Problem List   Diagnosis Date Noted  . 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 Allergic rhinitis 07/22/2015  . 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 Premature ovarian failure 05/19/2014  . Cardiac conduction disorder 09/24/2008  . 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 Avitaminosis D 09/24/2008    Past Surgical History:  Procedure Laterality Date  . WISDOM TOOTH EXTRACTION  age 57    Family History        Family Status  Relation Status  . Sister Deceased at age late 58       [place] Syndrome  . Brother Deceased at age 56       [place] Syndrome  . Brother Deceased at age late 40       [place] Syndrome  . Mother Alive  . Father Deceased       complications of pneumonia and CVA  . Mat Aunt Deceased  . Mat Uncle Deceased at age 46       cancer  . Other Deceased at age 29       [place] Syndrome  . Other Deceased at age 93       [place] Syndrome  . Other Deceased at age about 37       [place] Syndrome  . Mat Uncle Deceased  . Neg Hx (Not Specified)        Her family history  includes Cancer in her maternal uncle; Other in her brother, brother, other, other, other, and sister.     No Known Allergies   Current Outpatient Prescriptions:  .  CALCIUM PO, Take by mouth., Disp: , Rfl:  .  Multiple Vitamin (MULTIVITAMIN) tablet, Take 1 tablet by mouth daily., Disp: , Rfl:  .  Multiple Vitamins-Minerals (HAIR/SKIN/NAILS PO), Take by mouth., Disp: , Rfl:  .  Omega-3 Fatty Acids (FISH OIL PO), Take by mouth., Disp: , Rfl:  .  Vitamin D, Ergocalciferol, (DRISDOL) 50000 units CAPS capsule, TAKE ONE CAPSULE BY MOUTH ONCE PER MONTH, Disp: 30 capsule, Rfl: 3 .  aspirin 81 MG tablet, , Disp: , Rfl:  .  Clobetasol Propionate Emulsion 0.05 % topical foam, APPLY TO THE AFFECTED AREA EVERY NIGHT AT BEDTIME, Disp: , Rfl: 1   Patient Care Team: Margaretann LovelessBurnette, Candid Bovey M, PA-C as PCP - General (Family  Medicine)      Objective:   Vitals: BP 130/88 (BP Location: Left Arm, Patient Position: Sitting, Cuff Size: Large)   Pulse 64   Temp 98.2 F (36.8 C) (Oral)   Resp 16   Ht 5\' 6"  (1.676 m)   Wt 177 lb (80.3 kg)   LMP 01/10/2004 (Approximate)   BMI 28.57 kg/m    Vitals:   07/03/17 1021  BP: 130/88  Pulse: 64  Resp: 16  Temp: 98.2 F (36.8 C)  TempSrc: Oral  Weight: 177 lb (80.3 kg)  Height: 5\' 6"  (1.676 m)     Physical Exam  Constitutional: She is oriented to person, place, and time. She appears well-developed and well-nourished. No distress.  HENT:  Head: Normocephalic and atraumatic.  Right Ear: Hearing, tympanic membrane, external ear and ear canal normal.  Left Ear: Hearing, tympanic membrane, external ear and ear canal normal.  Nose: Nose normal.  Mouth/Throat: Uvula is midline, oropharynx is clear and moist and mucous membranes are normal. No oropharyngeal exudate.  Eyes: Pupils are equal, round, and reactive to light. Conjunctivae and EOM are normal. Right eye exhibits no discharge. Left eye exhibits no discharge. No scleral icterus.  Neck: Normal range of motion. Neck supple. No JVD present. Carotid bruit is not present. No tracheal deviation present. No thyromegaly present.  Cardiovascular: Normal rate, regular rhythm, normal heart sounds and intact distal pulses.  Exam reveals no gallop and no friction rub.   No murmur heard. Pulmonary/Chest: Effort normal and breath sounds normal. No respiratory distress. She has no wheezes. She has no rales. She exhibits no tenderness. Right breast exhibits no inverted nipple, no mass, no nipple discharge, no skin change and no tenderness. Left breast exhibits no inverted nipple, no mass, no nipple discharge, no skin change and no tenderness. Breasts are symmetrical.  Abdominal: Soft. Bowel sounds are normal. She exhibits no distension and no mass. There is no tenderness. There is no rebound and no guarding.  Musculoskeletal:  Normal range of motion. She exhibits no edema or tenderness.  Lymphadenopathy:    She has no cervical adenopathy.  Neurological: She is alert and oriented to person, place, and time.  Skin: Skin is warm and dry. No rash noted. She is not diaphoretic.  Psychiatric: She has a normal mood and affect. Her behavior is normal. Judgment and thought content normal.  Vitals reviewed.   Depression Screen PHQ 2/9 Scores 07/03/2017 07/22/2015  PHQ - 2 Score 0 0  PHQ- 9 Score 3 -    Assessment & Plan:     Routine Health Maintenance and Physical  Exam  Exercise Activities and Dietary recommendations Goals    None      Immunization History  Administered Date(s) Administered  . Hepatitis B 09/11/2013, 10/12/2013, 03/11/2014  . Tdap 05/09/2013  . Typhoid Inactivated 03/11/2014    Health Maintenance  Topic Date Due  . COLONOSCOPY  06/21/2011  . INFLUENZA VACCINE  04/11/2017  . MAMMOGRAM  08/25/2017  . PAP SMEAR  05/20/2018  . TETANUS/TDAP  05/10/2023  . Hepatitis C Screening  Completed  . HIV Screening  Completed     Discussed health benefits of physical activity, and encouraged her to engage in regular exercise appropriate for her age and condition.    1. Annual physical exam Normal physical exam today. Will check labs as below and f/u pending lab results. If labs are stable and WNL she will not need to have these rechecked for one year at her next annual physical exam. She is to call the office in the meantime if she has any acute issue, questions or concerns. - CBC w/Diff/Platelet - COMPLETE METABOLIC PANEL WITH GFR - TSH - Lipid Profile - HgB A1c  2. Breast cancer screening Breast exam today was normal. There is no family history of breast cancer. She does perform regular self breast exams. Mammogram was ordered as below. Information for [facility] was given to patient so she may schedule her mammogram at her convenience. - MM Digital Screening; Future  3.  Colon cancer screening Patient does not know what follow up time, knows she had polyps but does not remember what type or if precancerous. Abstracted report says 5-10 years. It was previously done in [place]. Previously [facility] by [facility] GYN [facility]. Their office was called and no report on file. Will refer to GI for colonoscopy.  - Ambulatory referral to Gastroenterology  4. Avitaminosis D On High dose vit D supplementation. Will recheck labs as below.  - Vitamin D (25 hydroxy)  5. Big thyroid Will check labs as below and f/u pending results. - TSH  6. Encounter for lipid screening for cardiovascular disease Will check labs as below and f/u pending results. - Lipid Profile  7. History of colon polyps See above medical treatment plan for #3.  - Ambulatory referral to Gastroenterology  --------------------------------------------------------------------    Margaretann Loveless, PA-C  [facility]

## 2017-07-04 ENCOUNTER — Telehealth: Payer: Self-pay

## 2017-07-04 LAB — CBC WITH DIFFERENTIAL/PLATELET
Basophils Absolute: 29 {cells}/uL (ref 0–200)
Basophils Relative: 0.5 %
Eosinophils Absolute: 51 {cells}/uL (ref 15–500)
Eosinophils Relative: 0.9 %
HCT: 40.9 % (ref 35.0–45.0)
Hemoglobin: 13.6 g/dL (ref 11.7–15.5)
Lymphs Abs: 2217 {cells}/uL (ref 850–3900)
MCH: 27.6 pg (ref 27.0–33.0)
MCHC: 33.3 g/dL (ref 32.0–36.0)
MCV: 83 fL (ref 80.0–100.0)
MPV: 10.6 fL (ref 7.5–12.5)
Monocytes Relative: 7.7 %
Neutro Abs: 2964 {cells}/uL (ref 1500–7800)
Neutrophils Relative %: 52 %
Platelets: 229 Thousand/uL (ref 140–400)
RBC: 4.93 Million/uL (ref 3.80–5.10)
RDW: 12.3 % (ref 11.0–15.0)
Total Lymphocyte: 38.9 %
WBC mixed population: 439 {cells}/uL (ref 200–950)
WBC: 5.7 Thousand/uL (ref 3.8–10.8)

## 2017-07-04 LAB — COMPLETE METABOLIC PANEL WITH GFR
AG Ratio: 1.7 (calc) (ref 1.0–2.5)
ALT: 27 U/L (ref 6–29)
AST: 23 U/L (ref 10–35)
Albumin: 4.4 g/dL (ref 3.6–5.1)
Alkaline phosphatase (APISO): 67 U/L (ref 33–130)
BUN: 14 mg/dL (ref 7–25)
CO2: 26 mmol/L (ref 20–32)
Calcium: 9.5 mg/dL (ref 8.6–10.4)
Chloride: 103 mmol/L (ref 98–110)
Creat: 0.8 mg/dL (ref 0.50–1.05)
GFR, Est African American: 96 mL/min/{1.73_m2} (ref >=60)
GFR, Est Non African American: 82 mL/min/{1.73_m2} (ref >=60)
Globulin: 2.6 g/dL (calc) (ref 1.9–3.7)
Glucose, Bld: 85 mg/dL (ref 65–99)
Potassium: 4.1 mmol/L (ref 3.5–5.3)
Sodium: 137 mmol/L (ref 135–146)
Total Bilirubin: 0.6 mg/dL (ref 0.2–1.2)
Total Protein: 7 g/dL (ref 6.1–8.1)

## 2017-07-04 LAB — HEMOGLOBIN A1C
Hgb A1c MFr Bld: 5.2 %{Hb} (ref <5.7)
Mean Plasma Glucose: 103 (calc)
eAG (mmol/L): 5.7 (calc)

## 2017-07-04 LAB — LIPID PANEL
Cholesterol: 171 mg/dL (ref <200)
HDL: 82 mg/dL (ref >50)
LDL Cholesterol (Calc): 73 mg/dL
Non-HDL Cholesterol (Calc): 89 mg/dL (ref <130)
Total CHOL/HDL Ratio: 2.1 (calc) (ref <5.0)
Triglycerides: 81 mg/dL (ref <150)

## 2017-07-04 LAB — VITAMIN D 25 HYDROXY (VIT D DEFICIENCY, FRACTURES): Vit D, 25-Hydroxy: 69 ng/mL (ref 30–100)

## 2017-07-04 LAB — TSH: TSH: 1.55 mIU/L (ref 0.40–4.50)

## 2017-07-04 NOTE — Telephone Encounter (Signed)
na

## 2017-07-04 NOTE — Telephone Encounter (Signed)
-----   Message from Margaretann LovelessJennifer M Burnette, PA-C sent at 07/04/2017 11:01 AM EDT ----- All labs are within normal limits and stable.  Thanks! -JB

## 2017-07-04 NOTE — Telephone Encounter (Signed)
Patient advised as below.  

## 2017-07-04 NOTE — Telephone Encounter (Signed)
Attempted to contact patient no answer and unable to leave a message due to voicemail being full.

## 2017-08-27 ENCOUNTER — Ambulatory Visit
Admission: RE | Admit: 2017-08-27 | Discharge: 2017-08-27 | Disposition: A | Discharge status: Home / Self Care | Payer: BLUE CROSS/BLUE SHIELD | Source: Ambulatory Visit | Attending: Physician Assistant | Admitting: Physician Assistant

## 2017-08-27 DIAGNOSIS — Z1231 Encounter for screening mammogram for malignant neoplasm of breast: Secondary | ICD-10-CM | POA: Insufficient documentation

## 2017-08-27 DIAGNOSIS — Z1239 Encounter for other screening for malignant neoplasm of breast: Secondary | ICD-10-CM

## 2017-08-28 ENCOUNTER — Telehealth: Payer: Self-pay

## 2017-08-28 NOTE — Telephone Encounter (Signed)
-----   Message from Margaretann LovelessJennifer M Burnette, [place]PA-C sent at 08/28/2017  1:55 PM EST ----- Normal mammogram. Repeat screening in one year.

## 2017-08-28 NOTE — Telephone Encounter (Signed)
lmtcb

## 2017-08-28 NOTE — Telephone Encounter (Signed)
Pt returned call

## 2017-08-29 NOTE — Telephone Encounter (Signed)
Tried to call pt back no answer.  

## 2017-08-30 NOTE — Telephone Encounter (Signed)
LMTCB ED 

## 2017-09-19 ENCOUNTER — Telehealth: Payer: Self-pay | Admitting: Physician Assistant

## 2017-09-19 NOTE — Telephone Encounter (Signed)
Patient is returned Emily ArntSarah Roth's call.

## 2017-09-21 ENCOUNTER — Other Ambulatory Visit: Payer: Self-pay

## 2017-09-21 ENCOUNTER — Telehealth: Payer: Self-pay

## 2017-09-21 DIAGNOSIS — Z1211 Encounter for screening for malignant neoplasm of colon: Secondary | ICD-10-CM

## 2017-09-21 NOTE — Telephone Encounter (Signed)
Gastroenterology Pre-Procedure Review  Request Date: 11/08/17 Requesting Physician: Dr. Maximino Greenlandahiliani  PATIENT REVIEW QUESTIONS: The patient responded to the following health history questions as indicated:    1. Are you having any GI issues? no 2. Do you have a personal history of Polyps? no 3. Do you have a family history of Colon Cancer or Polyps? no 4. Diabetes Mellitus? no 5. Joint replacements in the past 12 months?no 6. Major health problems in the past 3 months?no 7. Any artificial heart valves, MVP, or defibrillator?no    MEDICATIONS & ALLERGIES:    Patient reports the following regarding taking any anticoagulation/antiplatelet therapy:   Plavix, Coumadin, Eliquis, Xarelto, Lovenox, Pradaxa, Brilinta, or Effient? no Aspirin? no  Patient confirms/reports the following medications:  Current Outpatient Medications  Medication Sig Dispense Refill  . aspirin 81 MG tablet     . CALCIUM PO Take by mouth.    . Clobetasol Propionate Emulsion 0.05 % topical foam APPLY TO THE AFFECTED AREA EVERY NIGHT AT BEDTIME  1  . Multiple Vitamin (MULTIVITAMIN) tablet Take 1 tablet by mouth daily.    . Multiple Vitamins-Minerals (HAIR/SKIN/NAILS PO) Take by mouth.    . Omega-3 Fatty Acids (FISH OIL PO) Take by mouth.    . Vitamin D, Ergocalciferol, (DRISDOL) 50000 units CAPS capsule TAKE ONE CAPSULE BY MOUTH ONCE PER MONTH 30 capsule 3   No current facility-administered medications for this visit.     Patient confirms/reports the following allergies:  No Known Allergies  No orders of the defined types were placed in this encounter.   AUTHORIZATION INFORMATION Primary Insurance: 1D#: Group #:  Secondary Insurance: 1D#: Group #:  SCHEDULE INFORMATION: Date: 11/08/17 Time: Location: [facility]

## 2017-11-07 ENCOUNTER — Encounter: Payer: Self-pay | Admitting: *Deleted

## 2017-11-08 ENCOUNTER — Ambulatory Visit
Admission: RE | Admit: 2017-11-08 | Discharge: 2017-11-08 | Disposition: A | Discharge status: Home / Self Care | Payer: BLUE CROSS/BLUE SHIELD | Source: Ambulatory Visit | Attending: Gastroenterology | Admitting: Gastroenterology

## 2017-11-08 ENCOUNTER — Ambulatory Visit: Payer: BLUE CROSS/BLUE SHIELD | Admitting: Anesthesiology

## 2017-11-08 ENCOUNTER — Encounter
Admission: RE | Disposition: A | Discharge status: Home / Self Care | Payer: Self-pay | Source: Ambulatory Visit | Attending: Gastroenterology

## 2017-11-08 ENCOUNTER — Encounter: Payer: Self-pay | Admitting: Anesthesiology

## 2017-11-08 DIAGNOSIS — Z7982 Long term (current) use of aspirin: Secondary | ICD-10-CM | POA: Diagnosis not present

## 2017-11-08 DIAGNOSIS — Z1211 Encounter for screening for malignant neoplasm of colon: Secondary | ICD-10-CM | POA: Diagnosis not present

## 2017-11-08 DIAGNOSIS — K5730 Diverticulosis of large intestine without perforation or abscess without bleeding: Secondary | ICD-10-CM

## 2017-11-08 HISTORY — PX: COLONOSCOPY WITH PROPOFOL: SHX5780

## 2017-11-08 LAB — HM COLONOSCOPY

## 2017-11-08 SURGERY — COLONOSCOPY WITH PROPOFOL
Anesthesia: General

## 2017-11-08 MED ORDER — LIDOCAINE HCL (CARDIAC) 20 MG/ML IV SOLN
INTRAVENOUS | Status: DC | PRN
Start: 2017-11-08 — End: 2017-11-08
  Administered 2017-11-08: 50 mg via INTRATRACHEAL

## 2017-11-08 MED ORDER — SODIUM CHLORIDE 0.9 % IV SOLN
INTRAVENOUS | Status: DC
  Administered 2017-11-08: 1000 mL via INTRAVENOUS

## 2017-11-08 MED ORDER — PROPOFOL 500 MG/50ML IV EMUL
INTRAVENOUS | Status: AC
  Filled 2017-11-08: qty 50

## 2017-11-08 MED ORDER — LIDOCAINE HCL (PF) 1 % IJ SOLN
INTRAMUSCULAR | Status: AC
  Administered 2017-11-08: 0.3 mL via INTRADERMAL
  Filled 2017-11-08: qty 2

## 2017-11-08 MED ORDER — LIDOCAINE HCL (PF) 1 % IJ SOLN
2.0000 mL | Freq: Once | INTRAMUSCULAR | Status: AC
  Administered 2017-11-08: 0.3 mL via INTRADERMAL

## 2017-11-08 MED ORDER — PROPOFOL 10 MG/ML IV BOLUS
INTRAVENOUS | Status: DC | PRN
  Administered 2017-11-08 (×4): 50 mg via INTRAVENOUS
  Administered 2017-11-08: 100 mg via INTRAVENOUS
  Administered 2017-11-08 (×3): 50 mg via INTRAVENOUS

## 2017-11-08 MED ORDER — LIDOCAINE HCL (PF) 2 % IJ SOLN
INTRAMUSCULAR | Status: AC
  Filled 2017-11-08: qty 10

## 2017-11-08 NOTE — Anesthesia Postprocedure Evaluation (Signed)
Anesthesia Post Note  Patient: Emily Roth  Procedure(s) Performed: COLONOSCOPY WITH PROPOFOL (N/A )  Patient location during evaluation: PACU Anesthesia Type: General Level of consciousness: awake and alert and oriented Pain management: pain level controlled Vital Signs Assessment: post-procedure vital signs reviewed and stable Respiratory status: spontaneous breathing Cardiovascular status: blood pressure returned to baseline Anesthetic complications: no     Last Vitals:  Vitals:   11/08/17 1334 11/08/17 1344  BP: 111/82   Pulse: 60 68  Resp: (!) 21   Temp:    SpO2: 100% 100%    Last Pain:  Vitals:   11/08/17 1314  TempSrc: Tympanic                 Jena Tegeler

## 2017-11-08 NOTE — H&P (Signed)
Emily BouillonVarnita Larra Crunkleton, MD [address], [address], [place], [place], [id number] [address], [address], [place], [place], [address] Phone: [phone]  Fax: [phone]  Primary Care Physician:  Emily Roth, Jennifer M, PA-C   Pre-Procedure History & Physical: HPI:  Emily RocaMahalia L Roth is a 57 y.o. female is here for a colonoscopy.   Past Medical History:  Diagnosis Date  . Premature ovarian failure 2005   age 57    Past Surgical History:  Procedure Laterality Date  . WISDOM TOOTH EXTRACTION  age 57    Prior to Admission medications   Medication Sig Start Date End Date Taking? Authorizing Provider  aspirin 81 MG tablet  09/24/08   [provider]  CALCIUM PO Take by mouth.    [provider]  Clobetasol Propionate Emulsion 0.05 % topical foam APPLY TO THE AFFECTED AREA EVERY NIGHT AT BEDTIME 04/25/17   [provider]  Multiple Vitamin (MULTIVITAMIN) tablet Take 1 tablet by mouth daily.    [provider]  Multiple Vitamins-Minerals (HAIR/SKIN/NAILS PO) Take by mouth.    [provider]  Omega-3 Fatty Acids (FISH OIL PO) Take by mouth.    [provider]  Vitamin D, Ergocalciferol, (DRISDOL) 50000 units CAPS capsule TAKE ONE CAPSULE BY MOUTH ONCE PER MONTH 05/24/16   Ria CommentGrubb, Patricia, FNP    Allergies as of 09/21/2017  . (No Known Allergies)    Family History  Problem Relation Age of Onset  . Other Sister        [place] Syndrome  . Other Brother        [place] Syndrome  . Other Brother        [place] Syndrome  . Other Other        [place] Syndrome  . Other Other        [place] Syndrome  . Other Other        nephew - [place]  . Cancer Maternal Uncle        ?  Marland Kitchen. Breast cancer Neg Hx     Social History   Socioeconomic History  . Marital status: Single    Spouse name: Not on file  . Number of children: 1  . Years of education: Not on file  . Highest education level: Not on file  Social Needs  .  Financial resource strain: Not on file  . Food insecurity - worry: Not on file  . Food insecurity - inability: Not on file  . Transportation needs - medical: Not on file  . Transportation needs - non-medical: Not on file  Occupational History  . Not on file  Tobacco Use  . Smoking status: Never Smoker  . Smokeless tobacco: Never Used  Substance and Sexual Activity  . Alcohol use: No    Comment: rare  . Drug use: No  . Sexual activity: Not Currently    Partners: Male    Birth control/protection: Post-menopausal  Other Topics Concern  . Not on file  Social History Narrative   She works in [employer].  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    Review of Systems: See HPI, otherwise negative ROS  Physical Exam: BP 121/82   Pulse 80   Temp (!) 96.5 F (35.8 C) (Tympanic)   Resp 17   Ht 5\' 6"  (1.676 m)   Wt 168 lb (76.2 kg)   LMP 01/10/2004 (Approximate)   SpO2 100%  BMI 27.12 kg/m  General:   Alert,  pleasant and cooperative in NAD Head:  Normocephalic and atraumatic. Neck:  Supple; no masses or thyromegaly. Lungs:  Clear throughout to auscultation, normal respiratory effort.    Heart:  +S1, +S2, Regular rate and rhythm, No edema. Abdomen:  Soft, nontender and nondistended. Normal bowel sounds, without guarding, and without rebound.   Neurologic:  Alert and  oriented x4;  grossly normal neurologically.  Impression/Plan: Emily Roth is here for a colonoscopy to be performed for average risk screening.  Risks, benefits, limitations, and alternatives regarding  colonoscopy have been reviewed with the patient.  Questions have been answered.  All parties agreeable.   Pasty Spillers, MD  11/08/2017, 12:10 PM

## 2017-11-08 NOTE — Anesthesia Preprocedure Evaluation (Signed)
Anesthesia Evaluation  Patient identified by MRN, date of birth, ID band Patient awake    Reviewed: Allergy & Precautions, H&P , NPO status , Patient's Chart, lab work & pertinent test results, reviewed documented beta blocker date and time   Airway Mallampati: II   Neck ROM: full    Dental  (+) Poor Dentition   Pulmonary neg pulmonary ROS,    Pulmonary exam normal        Cardiovascular negative cardio ROS Normal cardiovascular exam Rhythm:regular Rate:Normal     Neuro/Psych PSYCHIATRIC DISORDERS negative neurological ROS  negative psych ROS   GI/Hepatic negative GI ROS, Neg liver ROS,   Endo/Other  negative endocrine ROS  Renal/GU negative Renal ROS  negative genitourinary   Musculoskeletal   Abdominal   Peds  Hematology negative hematology ROS (+)   Anesthesia Other Findings Past Medical History: 2005: Premature ovarian failure     Comment:  age 57 Past Surgical History: age 57: WISDOM TOOTH EXTRACTION BMI    Body Mass Index:  27.12 kg/m     Reproductive/Obstetrics negative OB ROS                             Anesthesia Physical Anesthesia Plan  ASA: II  Anesthesia Plan: General   Post-op Pain Management:    Induction:   PONV Risk Score and Plan:   Airway Management Planned:   Additional Equipment:   Intra-op Plan:   Post-operative Plan:   Informed Consent: I have reviewed the patients History and Physical, chart, labs and discussed the procedure including the risks, benefits and alternatives for the proposed anesthesia with the patient or authorized representative who has indicated his/her understanding and acceptance.   Dental Advisory Given  Plan Discussed with: CRNA  Anesthesia Plan Comments:         Anesthesia Quick Evaluation

## 2017-11-08 NOTE — Anesthesia Post-op Follow-up Note (Signed)
Anesthesia QCDR form completed.        

## 2017-11-08 NOTE — Anesthesia Postprocedure Evaluation (Signed)
Anesthesia Post Note  Patient: Emily Roth  Procedure(s) Performed: COLONOSCOPY WITH PROPOFOL (N/A )  Patient location during evaluation: Endoscopy Anesthesia Type: General Level of consciousness: awake and alert, oriented and patient cooperative Pain management: satisfactory to patient Vital Signs Assessment: post-procedure vital signs reviewed and stable Respiratory status: spontaneous breathing and respiratory function stable Cardiovascular status: blood pressure returned to baseline and stable Postop Assessment: no headache, no backache, patient able to bend at knees, no apparent nausea or vomiting and adequate PO intake Anesthetic complications: no     Last Vitals:  Vitals:   11/08/17 1148 11/08/17 1314  BP: 121/82 112/76  Pulse: 80 74  Resp: 17 16  Temp: (!) 35.8 C (!) 36.3 C  SpO2: 100% 100%    Last Pain:  Vitals:   11/08/17 1314  TempSrc: Tympanic                 Calani Gick H Alizandra Loh

## 2017-11-08 NOTE — Transfer of Care (Signed)
Immediate Anesthesia Transfer of Care Note  Patient: Emily RocaMahalia L Fluty  Procedure(s) Performed: COLONOSCOPY WITH PROPOFOL (N/A )  Patient Location: PACU  Anesthesia Type:General  Level of Consciousness: drowsy and patient cooperative  Airway & Oxygen Therapy: Patient Spontanous Breathing  Post-op Assessment: Report given to RN, Post -op Vital signs reviewed and stable and Patient moving all extremities X 4  Post vital signs: Reviewed and stable  Last Vitals:  Vitals:   11/08/17 1148  BP: 121/82  Pulse: 80  Resp: 17  Temp: (!) 35.8 C  SpO2: 100%    Last Pain:  Vitals:   11/08/17 1148  TempSrc: Tympanic         Complications: No apparent anesthesia complications

## 2017-11-08 NOTE — Op Note (Addendum)
[facility] Patient Name: Emily Roth Procedure Date: 11/08/2017 12:33 PM MRN: [id number] Account #: [id number] Date of Birth: 22-Nov-1960 Admit Type: Outpatient Age: 57 Room: [facility] ENDO ROOM 3 Gender: Female Note Status: Finalized Procedure:            Colonoscopy Indications:          Screening for colorectal malignant neoplasm Providers:             B. Maximino Greenland MD, MD Referring MD:         Margaretann Loveless (Referring MD) Medicines:            Monitored Anesthesia Care Complications:        No immediate complications. Procedure:            Pre-Anesthesia Assessment:                       - ASA Grade Assessment: II - A patient with mild                        systemic disease.                       - Prior to the procedure, a History and Physical was                        performed, and patient medications, allergies and                        sensitivities were reviewed. The patient's tolerance of                        previous anesthesia was reviewed.                       After obtaining informed consent, the colonoscope was                        passed under direct vision. Throughout the procedure,                        the patient's blood pressure, pulse, and oxygen                        saturations were monitored continuously. The                        Colonoscope was introduced through the anus and                        advanced to the the cecum, identified by appendiceal                        orifice and ileocecal valve. The colonoscopy was                        performed with ease. The patient tolerated the                        procedure well. The quality of the bowel preparation  was good. Findings:      The perianal and digital rectal examinations were normal.      Multiple small-mouthed diverticula were found in the sigmoid colon.       There was no evidence of diverticular bleeding.   The rectum, sigmoid colon, descending colon, transverse colon, ascending       colon and cecum appeared normal.      The entire examined colon appeared normal.      The retroflexed view of the distal rectum and anal verge was normal and       showed no anal or rectal abnormalities except hypertrophic anal papillae.      The diverticulosis caused sharp angulations in the colon. Hepatic       flexure area had a sharp angulation into the ascending colon as well.       The colonoscope was able to be advanced with ease, with the use of       delooping and withdrawal maneuvers, without the need for abdominal       pressure. The entire colon and these areas were carefully examined. Impression:           - Diverticulosis in the sigmoid colon. There was no                        evidence of diverticular bleeding.                       - The rectum, sigmoid colon, descending colon,                        transverse colon, ascending colon and cecum are normal.                       - The entire examined colon is normal.                       - The distal rectum and anal verge are normal on                        retroflexion view.                       - No specimens collected. Recommendation:       - Discharge patient to home (with escort).                       - Advance diet as tolerated.                       - Continue present medications.                       - Repeat colonoscopy in 5 years for screening purposes                        due to previous history of colon polyps. (Due to sharp                        angulations in patient's colon, I recommend abdominal                        pressure or frequent withdrawal and  delooping maneuvers                        on next colonoscopy as well, to allow for reaching the                        cecum with ease, and allow for good visualization)                       - The findings and recommendations were discussed with                         the patient.                       - The findings and recommendations were discussed with                        the patient's family.                       - Return to primary care physician as previously                        scheduled.                       - High fiber diet. Procedure Code(s):    --- Professional ---                       [phone], Colonoscopy, flexible; diagnostic, including                        collection of specimen(s) by brushing or washing, when                        performed (separate procedure) Diagnosis Code(s):    --- Professional ---                       Z12.11, Encounter for screening for malignant neoplasm                        of colon                       K57.30, Diverticulosis of large intestine without                        perforation or abscess without bleeding CPT copyright 2016 American Medical Association. All rights reserved. The codes documented in this report are preliminary and upon coder review may  be revised to meet current compliance requirements.  Melodie Bouillon, MD Michel Bickers B. Maximino Greenland MD, MD 11/08/2017 1:17:40 PM This report has been signed electronically. Number of Addenda: 0 Note Initiated On: 11/08/2017 12:33 PM Scope Withdrawal Time: 0 hours 19 minutes 34 seconds  Total Procedure Duration: 0 hours 27 minutes 34 seconds  Estimated Blood Loss: Estimated blood loss: none.      [facility]

## 2017-11-09 ENCOUNTER — Encounter: Payer: Self-pay | Admitting: Gastroenterology

## 2018-03-04 ENCOUNTER — Ambulatory Visit (INDEPENDENT_AMBULATORY_CARE_PROVIDER_SITE_OTHER): Payer: BLUE CROSS/BLUE SHIELD | Admitting: Physician Assistant

## 2018-03-04 ENCOUNTER — Encounter: Payer: Self-pay | Admitting: Physician Assistant

## 2018-03-04 VITALS — BP 128/70 | HR 88 | Temp 97.9°F | Resp 16 | Wt 166.8 lb

## 2018-03-04 DIAGNOSIS — S46911A Strain of unspecified muscle, fascia and tendon at shoulder and upper arm level, right arm, initial encounter: Secondary | ICD-10-CM | POA: Diagnosis not present

## 2018-03-04 MED ORDER — METHYLPREDNISOLONE 4 MG PO TBPK: ORAL_TABLET | ORAL | 0 refills | Status: DC

## 2018-03-04 NOTE — Patient Instructions (Signed)
Shoulder Exercises Ask your health care provider which exercises are safe for you. Do exercises exactly as told by your health care provider and adjust them as directed. It is normal to feel mild stretching, pulling, tightness, or discomfort as you do these exercises, but you should stop right away if you feel sudden pain or your pain gets worse.Do not begin these exercises until told by your health care provider. RANGE OF MOTION EXERCISES These exercises warm up your muscles and joints and improve the movement and flexibility of your shoulder. These exercises also help to relieve pain, numbness, and tingling. These exercises involve stretching your injured shoulder directly. Exercise A: Pendulum  1. Stand near a wall or a surface that you can hold onto for balance. 2. Bend at the waist and let your left / right arm hang straight down. Use your other arm to support you. Keep your back straight and do not lock your knees. 3. Relax your left / right arm and shoulder muscles, and move your hips and your trunk so your left / right arm swings freely. Your arm should swing because of the motion of your body, not because you are using your arm or shoulder muscles. 4. Keep moving your body so your arm swings in the following directions, as told by your health care provider: ? Side to side. ? Forward and backward. ? In clockwise and counterclockwise circles. 5. Continue each motion for __________ seconds, or for as long as told by your health care provider. 6. Slowly return to the starting position. Repeat __________ times. Complete this exercise __________ times a day. Exercise B:Flexion, Standing  1. Stand and hold a broomstick, a cane, or a similar object. Place your hands a little more than shoulder-width apart on the object. Your left / right hand should be palm-up, and your other hand should be palm-down. 2. Keep your elbow straight and keep your shoulder muscles relaxed. Push the stick down with  your healthy arm to raise your left / right arm in front of your body, and then over your head until you feel a stretch in your shoulder. ? Avoid shrugging your shoulder while you raise your arm. Keep your shoulder blade tucked down toward the middle of your back. 3. Hold for __________ seconds. 4. Slowly return to the starting position. Repeat __________ times. Complete this exercise __________ times a day. Exercise C: Abduction, Standing 1. Stand and hold a broomstick, a cane, or a similar object. Place your hands a little more than shoulder-width apart on the object. Your left / right hand should be palm-up, and your other hand should be palm-down. 2. While keeping your elbow straight and your shoulder muscles relaxed, push the stick across your body toward your left / right side. Raise your left / right arm to the side of your body and then over your head until you feel a stretch in your shoulder. ? Do not raise your arm above shoulder height, unless your health care provider tells you to do that. ? Avoid shrugging your shoulder while you raise your arm. Keep your shoulder blade tucked down toward the middle of your back. 3. Hold for __________ seconds. 4. Slowly return to the starting position. Repeat __________ times. Complete this exercise __________ times a day. Exercise D:Internal Rotation  1. Place your left / right hand behind your back, palm-up. 2. Use your other hand to dangle an exercise band, a towel, or a similar object over your shoulder. Grasp the band with   your left / right hand so you are holding onto both ends. 3. Gently pull up on the band until you feel a stretch in the front of your left / right shoulder. ? Avoid shrugging your shoulder while you raise your arm. Keep your shoulder blade tucked down toward the middle of your back. 4. Hold for __________ seconds. 5. Release the stretch by letting go of the band and lowering your hands. Repeat __________ times. Complete  this exercise __________ times a day. STRETCHING EXERCISES These exercises warm up your muscles and joints and improve the movement and flexibility of your shoulder. These exercises also help to relieve pain, numbness, and tingling. These exercises are done using your healthy shoulder to help stretch the muscles of your injured shoulder. Exercise E: Corner Stretch (External Rotation and Abduction)  1. Stand in a doorway with one of your feet slightly in front of the other. This is called a staggered stance. If you cannot reach your forearms to the door frame, stand facing a corner of a room. 2. Choose one of the following positions as told by your health care provider: ? Place your hands and forearms on the door frame above your head. ? Place your hands and forearms on the door frame at the height of your head. ? Place your hands on the door frame at the height of your elbows. 3. Slowly move your weight onto your front foot until you feel a stretch across your chest and in the front of your shoulders. Keep your head and chest upright and keep your abdominal muscles tight. 4. Hold for __________ seconds. 5. To release the stretch, shift your weight to your back foot. Repeat __________ times. Complete this stretch __________ times a day. Exercise F:Extension, Standing 1. Stand and hold a broomstick, a cane, or a similar object behind your back. ? Your hands should be a little wider than shoulder-width apart. ? Your palms should face away from your back. 2. Keeping your elbows straight and keeping your shoulder muscles relaxed, move the stick away from your body until you feel a stretch in your shoulder. ? Avoid shrugging your shoulders while you move the stick. Keep your shoulder blade tucked down toward the middle of your back. 3. Hold for __________ seconds. 4. Slowly return to the starting position. Repeat __________ times. Complete this exercise __________ times a day. STRENGTHENING  EXERCISES These exercises build strength and endurance in your shoulder. Endurance is the ability to use your muscles for a long time, even after they get tired. Exercise G:External Rotation  1. Sit in a stable chair without armrests. 2. Secure an exercise band at elbow height on your left / right side. 3. Place a soft object, such as a folded towel or a small pillow, between your left / right upper arm and your body to move your elbow a few inches away (about 10 cm) from your side. 4. Hold the end of the band so it is tight and there is no slack. 5. Keeping your elbow pressed against the soft object, move your left / right forearm out, away from your abdomen. Keep your body steady so only your forearm moves. 6. Hold for __________ seconds. 7. Slowly return to the starting position. Repeat __________ times. Complete this exercise __________ times a day. Exercise H:Shoulder Abduction  1. Sit in a stable chair without armrests, or stand. 2. Hold a __________ weight in your left / right hand, or hold an exercise band with both hands.   3. Start with your arms straight down and your left / right palm facing in, toward your body. 4. Slowly lift your left / right hand out to your side. Do not lift your hand above shoulder height unless your health care provider tells you that this is safe. ? Keep your arms straight. ? Avoid shrugging your shoulder while you do this movement. Keep your shoulder blade tucked down toward the middle of your back. 5. Hold for __________ seconds. 6. Slowly lower your arm, and return to the starting position. Repeat __________ times. Complete this exercise __________ times a day. Exercise I:Shoulder Extension 1. Sit in a stable chair without armrests, or stand. 2. Secure an exercise band to a stable object in front of you where it is at shoulder height. 3. Hold one end of the exercise band in each hand. Your palms should face each other. 4. Straighten your elbows and  lift your hands up to shoulder height. 5. Step back, away from the secured end of the exercise band, until the band is tight and there is no slack. 6. Squeeze your shoulder blades together as you pull your hands down to the sides of your thighs. Stop when your hands are straight down by your sides. Do not let your hands go behind your body. 7. Hold for __________ seconds. 8. Slowly return to the starting position. Repeat __________ times. Complete this exercise __________ times a day. Exercise J:Standing Shoulder Row 1. Sit in a stable chair without armrests, or stand. 2. Secure an exercise band to a stable object in front of you so it is at waist height. 3. Hold one end of the exercise band in each hand. Your palms should be in a thumbs-up position. 4. Bend each of your elbows to an "L" shape (about 90 degrees) and keep your upper arms at your sides. 5. Step back until the band is tight and there is no slack. 6. Slowly pull your elbows back behind you. 7. Hold for __________ seconds. 8. Slowly return to the starting position. Repeat __________ times. Complete this exercise __________ times a day. Exercise K:Shoulder Press-Ups  1. Sit in a stable chair that has armrests. Sit upright, with your feet flat on the floor. 2. Put your hands on the armrests so your elbows are bent and your fingers are pointing forward. Your hands should be about even with the sides of your body. 3. Push down on the armrests and use your arms to lift yourself off of the chair. Straighten your elbows and lift yourself up as much as you comfortably can. ? Move your shoulder blades down, and avoid letting your shoulders move up toward your ears. ? Keep your feet on the ground. As you get stronger, your feet should support less of your body weight as you lift yourself up. 4. Hold for __________ seconds. 5. Slowly lower yourself back into the chair. Repeat __________ times. Complete this exercise __________ times a  day. Exercise L: Wall Push-Ups  1. Stand so you are facing a stable wall. Your feet should be about one arm-length away from the wall. 2. Lean forward and place your palms on the wall at shoulder height. 3. Keep your feet flat on the floor as you bend your elbows and lean forward toward the wall. 4. Hold for __________ seconds. 5. Straighten your elbows to push yourself back to the starting position. Repeat __________ times. Complete this exercise __________ times a day. This information is not intended to replace advice   given to you by your health care provider. Make sure you discuss any questions you have with your health care provider. Document Released: 07/12/2005 Document Revised: 05/22/2016 Document Reviewed: 05/09/2015 Elsevier Interactive Patient Education  2018 [employer]  

## 2018-03-04 NOTE — Progress Notes (Signed)
Patient: Emily Roth Female    DOB: [birth year]   57 y.o.   MRN: [id number] Visit Date: 03/04/2018  Today's Provider: Margaretann Loveless, PA-C   Chief Complaint  Patient presents with  . Shoulder Pain   Subjective:    Shoulder Pain   The pain is present in the right shoulder. This is a new problem. The current episode started 1 to 4 weeks ago (2 weeks). The problem occurs daily. The problem has been unchanged. The quality of the pain is described as aching. The pain is mild. Pertinent negatives include no numbness, stiffness or tingling. The symptoms are aggravated by activity. She has tried NSAIDS and heat (IBU) for the symptoms. The treatment provided mild relief.  Reports she has been exercising at the gym and lifting weights. She also has to lift boxes overhead at work. She works at [employer].     No Known Allergies   Current Outpatient Medications:  .  CALCIUM PO, Take by mouth., Disp: , Rfl:  .  Clobetasol Propionate Emulsion 0.05 % topical foam, APPLY TO THE AFFECTED AREA EVERY NIGHT AT BEDTIME, Disp: , Rfl: 1 .  Multiple Vitamin (MULTIVITAMIN) tablet, Take 1 tablet by mouth daily., Disp: , Rfl:  .  Multiple Vitamins-Minerals (HAIR/SKIN/NAILS PO), Take by mouth., Disp: , Rfl:  .  Omega-3 Fatty Acids (FISH OIL PO), Take by mouth., Disp: , Rfl:  .  Vitamin D, Ergocalciferol, (DRISDOL) 50000 units CAPS capsule, TAKE ONE CAPSULE BY MOUTH ONCE PER MONTH, Disp: 30 capsule, Rfl: 3 .  aspirin 81 MG tablet, , Disp: , Rfl:   Review of Systems  Constitutional: Negative.   Respiratory: Negative.   Cardiovascular: Negative.   Musculoskeletal: Positive for arthralgias. Negative for joint swelling, myalgias, neck pain, neck stiffness and stiffness.  Neurological: Negative for tingling, weakness and numbness.    Social History   Tobacco Use  . Smoking status: Never Smoker  . Smokeless tobacco: Never Used  Substance Use Topics  . Alcohol use: No    Comment: rare    Objective:   BP 128/70 (BP Location: Left Arm, Patient Position: Sitting, Cuff Size: Normal)   Pulse 88   Temp 97.9 F (36.6 C) (Oral)   Resp 16   Wt 166 lb 12.8 oz (75.7 kg)   LMP 01/10/2004 (Approximate)   SpO2 97%   BMI 26.92 kg/m  Vitals:   03/04/18 1451  BP: 128/70  Pulse: 88  Resp: 16  Temp: 97.9 F (36.6 C)  TempSrc: Oral  SpO2: 97%  Weight: 166 lb 12.8 oz (75.7 kg)     Physical Exam  Constitutional: She appears well-developed and well-nourished. No distress.  Neck: Normal range of motion. Neck supple.  Cardiovascular: Normal rate, regular rhythm and normal heart sounds. Exam reveals no gallop and no friction rub.  No murmur heard. Pulmonary/Chest: Effort normal and breath sounds normal. No respiratory distress. She has no wheezes. She has no rales.  Musculoskeletal:       Right shoulder: She exhibits tenderness (over biceps tendon and insertion of rotator cuff muscle group). She exhibits normal range of motion, no bony tenderness, no pain, no spasm, normal pulse and normal strength.  Skin: She is not diaphoretic.  Vitals reviewed.      Assessment & Plan:     1. Muscle strain of right shoulder region, initial encounter Will give medrol dose pak as below for inflammation and strain. Advised to limit weights at the gym. May use  tylenol and biofreeze. May also use heating pad prn. Call if no improvements.  - methylPREDNISolone (MEDROL) 4 MG TBPK tablet; 6 day taper; take as directed on package instructions  Dispense: 21 tablet; Refill: 0       Margaretann LovelessJennifer M Jovonna Nickell, PA-C  [facility]

## 2018-04-08 ENCOUNTER — Telehealth: Payer: Self-pay | Admitting: Physician Assistant

## 2018-04-08 DIAGNOSIS — M25511 Pain in right shoulder: Secondary | ICD-10-CM

## 2018-04-08 MED ORDER — MELOXICAM 7.5 MG PO TABS: 7.5000 mg | ORAL_TABLET | Freq: Every day | ORAL | 0 refills | Status: AC

## 2018-04-08 NOTE — Telephone Encounter (Signed)
Meloxicam sent in. She can only take tylenol with it if she needs it.

## 2018-04-08 NOTE — Telephone Encounter (Signed)
Please Review

## 2018-04-08 NOTE — Telephone Encounter (Signed)
LM as directed below.  Thanks,  -Cathren Sween 

## 2018-04-08 NOTE — Telephone Encounter (Signed)
Was in for shoulder pain several weeks ago.  It got better temporary but is hurting again now.  She wants to know if there is something else she can take  [facility]  CB#  [phone]  Thanks Barth Kirksteri

## 2018-05-17 ENCOUNTER — Other Ambulatory Visit: Payer: Self-pay | Admitting: Physician Assistant

## 2018-05-17 MED ORDER — VITAMIN D (ERGOCALCIFEROL) 1.25 MG (50000 UNIT) PO CAPS: ORAL_CAPSULE | ORAL | 3 refills | Status: DC

## 2018-05-17 NOTE — Telephone Encounter (Signed)
Needs a refill on   Vit D 50000 units  [facility] [address]  CB#  [phone]  Thanks Barth Kirks

## 2018-07-15 ENCOUNTER — Other Ambulatory Visit (HOSPITAL_COMMUNITY)
Admission: RE | Admit: 2018-07-15 | Discharge: 2018-07-15 | Disposition: A | Discharge status: Home / Self Care | Payer: BLUE CROSS/BLUE SHIELD | Source: Ambulatory Visit | Attending: Physician Assistant | Admitting: Physician Assistant

## 2018-07-15 ENCOUNTER — Ambulatory Visit (INDEPENDENT_AMBULATORY_CARE_PROVIDER_SITE_OTHER): Payer: BLUE CROSS/BLUE SHIELD | Admitting: Physician Assistant

## 2018-07-15 ENCOUNTER — Encounter: Payer: Self-pay | Admitting: Physician Assistant

## 2018-07-15 VITALS — BP 128/70 | HR 79 | Temp 98.6°F | Resp 16 | Ht 66.0 in | Wt 163.2 lb

## 2018-07-15 DIAGNOSIS — Z136 Encounter for screening for cardiovascular disorders: Secondary | ICD-10-CM

## 2018-07-15 DIAGNOSIS — Z124 Encounter for screening for malignant neoplasm of cervix: Secondary | ICD-10-CM | POA: Insufficient documentation

## 2018-07-15 DIAGNOSIS — Z0000 Encounter for general adult medical examination without abnormal findings: Secondary | ICD-10-CM

## 2018-07-15 DIAGNOSIS — Z1239 Encounter for other screening for malignant neoplasm of breast: Secondary | ICD-10-CM

## 2018-07-15 DIAGNOSIS — Z13220 Encounter for screening for lipoid disorders: Secondary | ICD-10-CM

## 2018-07-15 NOTE — Patient Instructions (Signed)
Health Maintenance for Postmenopausal Women Menopause is a normal process in which your reproductive ability comes to an end. This process happens gradually over a span of months to years, usually between the ages of 22 and 9. Menopause is complete when you have missed 12 consecutive menstrual periods. It is important to talk with your health care provider about some of the most common conditions that affect postmenopausal women, such as heart disease, cancer, and bone loss (osteoporosis). Adopting a healthy lifestyle and getting preventive care can help to promote your health and wellness. Those actions can also lower your chances of developing some of these common conditions. What should I know about menopause? During menopause, you may experience a number of symptoms, such as:  Moderate-to-severe hot flashes.  Night sweats.  Decrease in sex drive.  Mood swings.  Headaches.  Tiredness.  Irritability.  Memory problems.  Insomnia.  Choosing to treat or not to treat menopausal changes is an individual decision that you make with your health care provider. What should I know about hormone replacement therapy and supplements? Hormone therapy products are effective for treating symptoms that are associated with menopause, such as hot flashes and night sweats. Hormone replacement carries certain risks, especially as you become older. If you are thinking about using estrogen or estrogen with progestin treatments, discuss the benefits and risks with your health care provider. What should I know about heart disease and stroke? Heart disease, heart attack, and stroke become more likely as you age. This may be due, in part, to the hormonal changes that your body experiences during menopause. These can affect how your body processes dietary fats, triglycerides, and cholesterol. Heart attack and stroke are both medical emergencies. There are many things that you can do to help prevent heart disease  and stroke:  Have your blood pressure checked at least every 1-2 years. High blood pressure causes heart disease and increases the risk of stroke.  If you are 53-22 years old, ask your health care provider if you should take aspirin to prevent a heart attack or a stroke.  Do not use any tobacco products, including cigarettes, chewing tobacco, or electronic cigarettes. If you need help quitting, ask your health care provider.  It is important to eat a healthy diet and maintain a healthy weight. ? Be sure to include plenty of vegetables, fruits, low-fat dairy products, and lean protein. ? Avoid eating foods that are high in solid fats, added sugars, or salt (sodium).  Get regular exercise. This is one of the most important things that you can do for your health. ? Try to exercise for at least 150 minutes each week. The type of exercise that you do should increase your heart rate and make you sweat. This is known as moderate-intensity exercise. ? Try to do strengthening exercises at least twice each week. Do these in addition to the moderate-intensity exercise.  Know your numbers.Ask your health care provider to check your cholesterol and your blood glucose. Continue to have your blood tested as directed by your health care provider.  What should I know about cancer screening? There are several types of cancer. Take the following steps to reduce your risk and to catch any cancer development as early as possible. Breast Cancer  Practice breast self-awareness. ? This means understanding how your breasts normally appear and feel. ? It also means doing regular breast self-exams. Let your health care provider know about any changes, no matter how small.  If you are 40  or older, have a clinician do a breast exam (clinical breast exam or CBE) every year. Depending on your age, family history, and medical history, it may be recommended that you also have a yearly breast X-ray (mammogram).  If you  have a family history of breast cancer, talk with your health care provider about genetic screening.  If you are at high risk for breast cancer, talk with your health care provider about having an MRI and a mammogram every year.  Breast cancer (BRCA) gene test is recommended for women who have family members with BRCA-related cancers. Results of the assessment will determine the need for genetic counseling and BRCA1 and for BRCA2 testing. BRCA-related cancers include these types: ? Breast. This occurs in males or females. ? Ovarian. ? Tubal. This may also be called fallopian tube cancer. ? Cancer of the abdominal or pelvic lining (peritoneal cancer). ? Prostate. ? Pancreatic.  Cervical, Uterine, and Ovarian Cancer Your health care provider may recommend that you be screened regularly for cancer of the pelvic organs. These include your ovaries, uterus, and vagina. This screening involves a pelvic exam, which includes checking for microscopic changes to the surface of your cervix (Pap test).  For women ages 21-65, health care providers may recommend a pelvic exam and a Pap test every three years. For women ages 79-65, they may recommend the Pap test and pelvic exam, combined with testing for human papilloma virus (HPV), every five years. Some types of HPV increase your risk of cervical cancer. Testing for HPV may also be done on women of any age who have unclear Pap test results.  Other health care providers may not recommend any screening for nonpregnant women who are considered low risk for pelvic cancer and have no symptoms. Ask your health care provider if a screening pelvic exam is right for you.  If you have had past treatment for cervical cancer or a condition that could lead to cancer, you need Pap tests and screening for cancer for at least 20 years after your treatment. If Pap tests have been discontinued for you, your risk factors (such as having a new sexual partner) need to be  reassessed to determine if you should start having screenings again. Some women have medical problems that increase the chance of getting cervical cancer. In these cases, your health care provider may recommend that you have screening and Pap tests more often.  If you have a family history of uterine cancer or ovarian cancer, talk with your health care provider about genetic screening.  If you have vaginal bleeding after reaching menopause, tell your health care provider.  There are currently no reliable tests available to screen for ovarian cancer.  Lung Cancer Lung cancer screening is recommended for adults 69-62 years old who are at high risk for lung cancer because of a history of smoking. A yearly low-dose CT scan of the lungs is recommended if you:  Currently smoke.  Have a history of at least 30 pack-years of smoking and you currently smoke or have quit within the past 15 years. A pack-year is smoking an average of one pack of cigarettes per day for one year.  Yearly screening should:  Continue until it has been 15 years since you quit.  Stop if you develop a health problem that would prevent you from having lung cancer treatment.  Colorectal Cancer  This type of cancer can be detected and can often be prevented.  Routine colorectal cancer screening usually begins at  age 42 and continues through age 45.  If you have risk factors for colon cancer, your health care provider may recommend that you be screened at an earlier age.  If you have a family history of colorectal cancer, talk with your health care provider about genetic screening.  Your health care provider may also recommend using home test kits to check for hidden blood in your stool.  A small camera at the end of a tube can be used to examine your colon directly (sigmoidoscopy or colonoscopy). This is done to check for the earliest forms of colorectal cancer.  Direct examination of the colon should be repeated every  5-10 years until age 71. However, if early forms of precancerous polyps or small growths are found or if you have a family history or genetic risk for colorectal cancer, you may need to be screened more often.  Skin Cancer  Check your skin from head to toe regularly.  Monitor any moles. Be sure to tell your health care provider: ? About any new moles or changes in moles, especially if there is a change in a mole's shape or color. ? If you have a mole that is larger than the size of a pencil eraser.  If any of your family members has a history of skin cancer, especially at a young age, talk with your health care provider about genetic screening.  Always use sunscreen. Apply sunscreen liberally and repeatedly throughout the day.  Whenever you are outside, protect yourself by wearing long sleeves, pants, a wide-brimmed hat, and sunglasses.  What should I know about osteoporosis? Osteoporosis is a condition in which bone destruction happens more quickly than new bone creation. After menopause, you may be at an increased risk for osteoporosis. To help prevent osteoporosis or the bone fractures that can happen because of osteoporosis, the following is recommended:  If you are 46-71 years old, get at least 1,000 mg of calcium and at least 600 mg of vitamin D per day.  If you are older than age 55 but younger than age 65, get at least 1,200 mg of calcium and at least 600 mg of vitamin D per day.  If you are older than age 54, get at least 1,200 mg of calcium and at least 800 mg of vitamin D per day.  Smoking and excessive alcohol intake increase the risk of osteoporosis. Eat foods that are rich in calcium and vitamin D, and do weight-bearing exercises several times each week as directed by your health care provider. What should I know about how menopause affects my mental health? Depression may occur at any age, but it is more common as you become older. Common symptoms of depression  include:  Low or sad mood.  Changes in sleep patterns.  Changes in appetite or eating patterns.  Feeling an overall lack of motivation or enjoyment of activities that you previously enjoyed.  Frequent crying spells.  Talk with your health care provider if you think that you are experiencing depression. What should I know about immunizations? It is important that you get and maintain your immunizations. These include:  Tetanus, diphtheria, and pertussis (Tdap) booster vaccine.  Influenza every year before the flu season begins.  Pneumonia vaccine.  Shingles vaccine.  Your health care provider may also recommend other immunizations. This information is not intended to replace advice given to you by your health care provider. Make sure you discuss any questions you have with your health care provider. Document Released: 10/20/2005  Document Revised: 03/17/2016 Document Reviewed: 06/01/2015 [employer] Interactive Patient Education  2018 [employer]  

## 2018-07-15 NOTE — Progress Notes (Signed)
Patient: Emily Roth, Female    DOB: 09/07/1961, 57 y.o.   MRN: [id number] Visit Date: 07/15/2018  Today's Provider: Margaretann Loveless, PA-C   Chief Complaint  Patient presents with  . Annual Exam   Subjective:    Annual physical exam Emily Roth is a 57 y.o. female who presents today for health maintenance and complete physical. She feels well. She reports exercising some. She reports she is sleeping fairly well.  Last CPE:07/03/2017 Pap:05/21/15 by GYN, HPV,HR not detected, Atrophy noted. Mammogram:08/27/17 BI-RADS 1 Colonoscopy:11/08/17 Repeat in 5 yrs-Due to previous hx of Colon Polyps. -----------------------------------------------------------------   Review of Systems  Constitutional: Negative.   HENT: Negative.   Eyes: Negative.   Respiratory: Negative.   Cardiovascular: Negative.   Gastrointestinal: Negative.   Endocrine: Negative.   Genitourinary: Negative.   Musculoskeletal: Negative.   Skin: Negative.   Allergic/Immunologic: Positive for environmental allergies.  Neurological: Positive for numbness ("in her fingers off and on in the AM").  Hematological: Negative.   Psychiatric/Behavioral: Negative.     Social History      She  reports that she has never smoked. She has never used smokeless tobacco. She reports that she does not drink alcohol or use drugs.       Social History   Socioeconomic History  . Marital status: Single    Spouse name: Not on file  . Number of children: 1  . Years of education: Not on file  . Highest education level: Not on file  Occupational History  . Not on file  Social Needs  . Financial resource strain: Not on file  . Food insecurity:    Worry: Not on file    Inability: Not on file  . Transportation needs:    Medical: Not on file    Non-medical: Not on file  Tobacco Use  . Smoking status: Never Smoker  . Smokeless tobacco: Never Used  Substance and Sexual Activity  . Alcohol use: No    Comment:  rare  . Drug use: No  . Sexual activity: Not Currently    Partners: Male    Birth control/protection: Post-menopausal  Lifestyle  . Physical activity:    Days per week: Not on file    Minutes per session: Not on file  . Stress: Not on file  Relationships  . Social connections:    Talks on phone: Not on file    Gets together: Not on file    Attends religious service: Not on file    Active member of club or organization: Not on file    Attends meetings of clubs or organizations: Not on file    Relationship status: Not on file  Other Topics Concern  . Not on file  Social History Narrative   She works in Engineering geologist.  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    Past Medical History:  Diagnosis Date  . Premature ovarian failure 2005   age 37     Patient Active Problem List   Diagnosis Date Noted  . Special screening for malignant neoplasms, colon   . Diverticulosis of large intestine without diverticulitis   . History of colon polyps 07/03/2017  . Family history of carrier of genetic disease 07/03/2017  . 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 Allergic rhinitis 07/22/2015  . 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 Premature ovarian failure 05/19/2014  . Cardiac conduction disorder 09/24/2008  . Big  thyroid 09/24/2008  . Avitaminosis D 09/24/2008    Past Surgical History:  Procedure Laterality Date  . COLONOSCOPY WITH PROPOFOL N/A 11/08/2017   Procedure: COLONOSCOPY WITH PROPOFOL;  Surgeon: Pasty Spillers, MD;  Location: [facility] ENDOSCOPY;  Service: Endoscopy;  Laterality: N/A;  . WISDOM TOOTH EXTRACTION  age 27    Family History        Family Status  Relation Name Status  . Sister  Deceased at age late 69       [place] Syndrome  . Brother  Deceased at age 7       [place] Syndrome  . Brother  Deceased at age late 75       [place] Syndrome  . Mother  Alive  . Father  Deceased       complications of  pneumonia and CVA  . Mat Aunt  Deceased  . Mat Uncle  Deceased at age 8       cancer  . Other Murtis Sink Deceased at age 13       [place] Syndrome  . Other Neice Luna Kitchens Deceased at age 36       [place] Syndrome  . Other Nephew Feliz Beam Deceased at age about 26       [place] Syndrome  . Mat Uncle  Deceased  . Neg Hx  (Not Specified)        Her family history includes Cancer in her maternal uncle; Other in her brother, brother, other, other, other, and sister. There is no history of Breast cancer.      No Known Allergies   Current Outpatient Medications:  .  aspirin 81 MG tablet, , Disp: , Rfl:  .  CALCIUM PO, Take by mouth., Disp: , Rfl:  .  Clobetasol Propionate Emulsion 0.05 % topical foam, APPLY TO THE AFFECTED AREA EVERY NIGHT AT BEDTIME, Disp: , Rfl: 1 .  meloxicam (MOBIC) 7.5 MG tablet, Take 1 tablet (7.5 mg total) by mouth daily., Disp: 30 tablet, Rfl: 0 .  Multiple Vitamin (MULTIVITAMIN) tablet, Take 1 tablet by mouth daily., Disp: , Rfl:  .  Multiple Vitamins-Minerals (HAIR/SKIN/NAILS PO), Take by mouth., Disp: , Rfl:  .  Omega-3 Fatty Acids (FISH OIL PO), Take by mouth., Disp: , Rfl:  .  Vitamin D, Ergocalciferol, (DRISDOL) 50000 units CAPS capsule, TAKE ONE CAPSULE BY MOUTH ONCE PER MONTH, Disp: 4 capsule, Rfl: 3   Patient Care Team: Margaretann Loveless, PA-C as PCP - General (Family Medicine)      Objective:   Vitals: BP 128/70 (BP Location: Left Arm, Patient Position: Sitting, Cuff Size: Normal)   Pulse 79   Temp 98.6 F (37 C) (Oral)   Resp 16   Ht 5\' 6"  (1.676 m)   Wt 163 lb 3.2 oz (74 kg)   LMP 01/10/2004 (Approximate)   SpO2 98%   BMI 26.34 kg/m    Vitals:   07/15/18 1350  BP: 128/70  Pulse: 79  Resp: 16  Temp: 98.6 F (37 C)  TempSrc: Oral  SpO2: 98%  Weight: 163 lb 3.2 oz (74 kg)  Height: 5\' 6"  (1.676 m)     Physical Exam  Constitutional: She is oriented to person, place, and time. She appears well-developed and well-nourished.  No distress.  HENT:  Head: Normocephalic and atraumatic.  Right Ear: Hearing, tympanic membrane, external ear and ear canal normal.  Left Ear: Hearing, tympanic membrane, external ear and ear canal normal.  Nose: Nose normal.  Mouth/Throat: Uvula is midline, oropharynx is clear and moist and mucous membranes are normal. No oropharyngeal exudate.  Eyes: Pupils are equal, round, and reactive to light. Conjunctivae and EOM are normal. Right eye exhibits no discharge. Left eye exhibits no discharge. No scleral icterus.  Neck: Normal range of motion. Neck supple. No JVD present. Carotid bruit is not present. No tracheal deviation present. No thyromegaly present.  Cardiovascular: Normal rate, regular rhythm, normal heart sounds and intact distal pulses. Exam reveals no gallop and no friction rub.  No murmur heard. Pulmonary/Chest: Effort normal and breath sounds normal. No respiratory distress. She has no wheezes. She has no rales. She exhibits no tenderness. Right breast exhibits no inverted nipple, no mass, no nipple discharge, no skin change and no tenderness. Left breast exhibits no inverted nipple, no mass, no nipple discharge, no skin change and no tenderness. No breast tenderness, discharge or bleeding. Breasts are symmetrical.  Abdominal: Soft. Bowel sounds are normal. She exhibits no distension and no mass. There is no tenderness. There is no rebound and no guarding. Hernia confirmed negative in the right inguinal area and confirmed negative in the left inguinal area.  Genitourinary: Rectum normal, vagina normal and uterus normal. No breast tenderness, discharge or bleeding. Pelvic exam was performed with patient supine. There is no rash, tenderness, lesion or injury on the right labia. There is no rash, tenderness, lesion or injury on the left labia. Cervix exhibits no motion tenderness, no discharge and no friability. Right adnexum displays no mass, no tenderness and no fullness. Left adnexum  displays no mass, no tenderness and no fullness. No erythema, tenderness or bleeding in the vagina. No signs of injury around the vagina. No vaginal discharge found.  Musculoskeletal: Normal range of motion. She exhibits no edema or tenderness.  Lymphadenopathy:    She has no cervical adenopathy.       Right: No inguinal adenopathy present.       Left: No inguinal adenopathy present.  Neurological: She is alert and oriented to person, place, and time. She has normal reflexes. No cranial nerve deficit. Coordination normal.  Skin: Skin is warm and dry. No rash noted. She is not diaphoretic.  Psychiatric: She has a normal mood and affect. Her behavior is normal. Judgment and thought content normal.  Vitals reviewed.    Depression Screen PHQ 2/9 Scores 07/15/2018 07/03/2017 07/22/2015  PHQ - 2 Score 0 0 0  PHQ- 9 Score - 3 -      Assessment & Plan:     Routine Health Maintenance and Physical Exam  Exercise Activities and Dietary recommendations Goals   None     Immunization History  Administered Date(s) Administered  . Hepatitis B 09/11/2013, 10/12/2013, 03/11/2014  . Tdap 05/09/2013  . Typhoid Inactivated 03/11/2014    Health Maintenance  Topic Date Due  . INFLUENZA VACCINE  04/11/2018  . PAP SMEAR  05/20/2018  . MAMMOGRAM  08/27/2018  . TETANUS/TDAP  05/10/2023  . COLONOSCOPY  11/09/2027  . Hepatitis C Screening  Completed  . HIV Screening  Completed     Discussed health benefits of physical activity, and encouraged her to engage in regular exercise appropriate for her age and condition.    1. Annual physical exam Normal physical exam today. Will check labs as below and f/u pending lab results. If labs are stable and WNL she will not need to have these rechecked for one year at her next annual physical exam. She is to call the office in  the meantime if she has any acute issue, questions or concerns. - Comprehensive metabolic panel - CBC with  Differential/Platelet - Hemoglobin A1c - TSH  2. Breast cancer screening Breast exam today was normal. There is no family history of breast cancer. She does perform regular self breast exams. Mammogram was ordered as below. Information for [facility] was given to patient so she may schedule her mammogram at her convenience. - MM 3D SCREEN BREAST BILATERAL; Future  3. Encounter for lipid screening for cardiovascular disease Will check labs as below and f/u pending results. - Lipid panel  4. Encounter for screening for cervical cancer  Pap collected today. Will send as below and f/u pending results. - Cytology - PAP  --------------------------------------------------------------------    Margaretann Loveless, PA-C  [facility]

## 2018-07-16 ENCOUNTER — Telehealth: Payer: Self-pay

## 2018-07-16 LAB — COMPREHENSIVE METABOLIC PANEL
ALT: 18 IU/L (ref 0–32)
AST: 23 IU/L (ref 0–40)
Albumin/Globulin Ratio: 2 (ref 1.2–2.2)
Albumin: 4.6 g/dL (ref 3.5–5.5)
Alkaline Phosphatase: 80 IU/L (ref 39–117)
BUN/Creatinine Ratio: 24 — ABNORMAL HIGH (ref 9–23)
BUN: 18 mg/dL (ref 6–24)
Bilirubin Total: 0.5 mg/dL (ref 0.0–1.2)
CO2: 21 mmol/L (ref 20–29)
Calcium: 9.6 mg/dL (ref 8.7–10.2)
Chloride: 106 mmol/L (ref 96–106)
Creatinine, Ser: 0.76 mg/dL (ref 0.57–1.00)
GFR calc Af Amer: 101 mL/min/{1.73_m2} (ref >59)
GFR calc non Af Amer: 87 mL/min/{1.73_m2} (ref >59)
Globulin, Total: 2.3 g/dL (ref 1.5–4.5)
Glucose: 72 mg/dL (ref 65–99)
Potassium: 3.8 mmol/L (ref 3.5–5.2)
Sodium: 141 mmol/L (ref 134–144)
Total Protein: 6.9 g/dL (ref 6.0–8.5)

## 2018-07-16 LAB — TSH: TSH: 1.64 u[IU]/mL (ref 0.450–4.500)

## 2018-07-16 LAB — CBC WITH DIFFERENTIAL/PLATELET
Basophils Absolute: 0 10*3/uL (ref 0.0–0.2)
Basos: 0 %
EOS (ABSOLUTE): 0.1 10*3/uL (ref 0.0–0.4)
Eos: 1 %
Hematocrit: 39.9 % (ref 34.0–46.6)
Hemoglobin: 13 g/dL (ref 11.1–15.9)
Immature Grans (Abs): 0 10*3/uL (ref 0.0–0.1)
Immature Granulocytes: 0 %
Lymphocytes Absolute: 2.6 10*3/uL (ref 0.7–3.1)
Lymphs: 34 %
MCH: 27.5 pg (ref 26.6–33.0)
MCHC: 32.6 g/dL (ref 31.5–35.7)
MCV: 85 fL (ref 79–97)
Monocytes Absolute: 0.6 10*3/uL (ref 0.1–0.9)
Monocytes: 8 %
Neutrophils Absolute: 4.3 10*3/uL (ref 1.4–7.0)
Neutrophils: 57 %
Platelets: 272 10*3/uL (ref 150–450)
RBC: 4.72 x10E6/uL (ref 3.77–5.28)
RDW: 12.3 % (ref 12.3–15.4)
WBC: 7.7 10*3/uL (ref 3.4–10.8)

## 2018-07-16 LAB — LIPID PANEL
Chol/HDL Ratio: 2.1 ratio (ref 0.0–4.4)
Cholesterol, Total: 187 mg/dL (ref 100–199)
HDL: 90 mg/dL (ref >39)
LDL Calculated: 87 mg/dL (ref 0–99)
Triglycerides: 49 mg/dL (ref 0–149)
VLDL Cholesterol Cal: 10 mg/dL (ref 5–40)

## 2018-07-16 LAB — HEMOGLOBIN A1C
Est. average glucose Bld gHb Est-mCnc: 103 mg/dL
Hgb A1c MFr Bld: 5.2 % (ref 4.8–5.6)

## 2018-07-16 NOTE — Telephone Encounter (Signed)
LVMTRC 

## 2018-07-16 NOTE — Telephone Encounter (Signed)
-----   Message from Margaretann Loveless, [place] sent at 07/16/2018  2:42 PM EST ----- All labs are within normal limits and stable.  Thanks! -JB

## 2018-07-17 LAB — CYTOLOGY - PAP
Diagnosis: NEGATIVE
HPV: NOT DETECTED

## 2018-07-17 NOTE — Telephone Encounter (Signed)
Patient was advised.  

## 2018-08-28 ENCOUNTER — Ambulatory Visit
Admission: RE | Admit: 2018-08-28 | Discharge: 2018-08-28 | Disposition: A | Discharge status: Home / Self Care | Payer: BLUE CROSS/BLUE SHIELD | Source: Ambulatory Visit | Attending: Physician Assistant | Admitting: Physician Assistant

## 2018-08-28 ENCOUNTER — Telehealth: Payer: Self-pay

## 2018-08-28 DIAGNOSIS — Z1239 Encounter for other screening for malignant neoplasm of breast: Secondary | ICD-10-CM | POA: Diagnosis not present

## 2018-08-28 NOTE — Telephone Encounter (Signed)
lmtcb

## 2018-08-28 NOTE — Telephone Encounter (Signed)
-----   Message from Margaretann LovelessJennifer M Burnette, PA-C sent at 08/28/2018  3:38 PM EST ----- Normal mammogram. Repeat screening in one year.

## 2018-08-30 NOTE — Telephone Encounter (Signed)
Patient advised as below.  

## 2019-06-18 ENCOUNTER — Other Ambulatory Visit: Payer: Self-pay | Admitting: Family Medicine

## 2019-06-18 DIAGNOSIS — E559 Vitamin D deficiency, unspecified: Secondary | ICD-10-CM

## 2019-06-18 NOTE — Telephone Encounter (Signed)
See Rx request ° °

## 2019-07-15 NOTE — Progress Notes (Signed)
Patient: Emily Roth, Female    DOB: [birth year], 58 y.o.   MRN: [id number] Visit Date: 07/21/2019  Today's Provider: Mar Daring, PA-C   Chief Complaint  Patient presents with  . Annual Exam   Subjective:     Annual physical exam Emily Roth is a 58 y.o. female who presents today for health maintenance and complete physical. She feels fairly well, patient reports that she has noticed in the mornings upon awakening she has numbness in her hand, she states that it varies between right and left hand lasting only a few minutes . She reports exercising by walking daily and work duties at [employer]. She reports she is sleeping fairly well, on average patient reports that she sleeps between 5-6 hrs a night waking up occasionally throughout the night. ----------------------------------------------------------------- 07/17/2018-Pap is normal, HPV negative. Will repeat in 3-5 years. 08/28/2018 -Normal mammogram. Repeat screening in one year. 11/08/17-Colonoscopy:Repeat colonoscopy in 5 years for screening purposes due to previous history of colon polyps.  Review of Systems  Constitutional: Negative.   HENT: Negative.   Eyes: Negative.   Respiratory: Negative.   Cardiovascular: Negative.   Gastrointestinal: Negative.   Endocrine: Negative.   Genitourinary: Negative.   Musculoskeletal: Negative.   Skin: Negative.   Allergic/Immunologic: Negative.   Neurological: Negative.   Hematological: Negative.   Psychiatric/Behavioral: Negative.     Social History      She  reports that she has never smoked. She has never used smokeless tobacco. She reports that she does not drink alcohol or use drugs.       Social History   Socioeconomic History  . Marital status: Single    Spouse name: Not on file  . Number of children: 1  . Years of education: Not on file  . Highest education level: Not on file  Occupational History  . Not on file  Social Needs  . Financial resource  strain: Not on file  . Food insecurity    Worry: Not on file    Inability: Not on file  . Transportation needs    Medical: Not on file    Non-medical: Not on file  Tobacco Use  . Smoking status: Never Smoker  . Smokeless tobacco: Never Used  Substance and Sexual Activity  . Alcohol use: No    Comment: rare  . Drug use: No  . Sexual activity: Not Currently    Partners: Male    Birth control/protection: Post-menopausal  Lifestyle  . Physical activity    Days per week: Not on file    Minutes per session: Not on file  . Stress: Not on file  Relationships  . Social Herbalist on phone: Not on file    Gets together: Not on file    Attends religious service: Not on file    Active member of club or organization: Not on file    Attends meetings of clubs or organizations: Not on file    Relationship status: Not on file  Other Topics Concern  . Not on file  Social History Narrative   She works in Scientist, research (medical).  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    Past Medical History:  Diagnosis Date  . Premature ovarian failure 2005   age 18     Patient Active Problem List   Diagnosis Date Noted  . Special screening for malignant neoplasms, colon   .  Diverticulosis of large intestine without diverticulitis   . History of colon polyps 07/03/2017  . Family history of carrier of genetic disease 07/03/2017  . 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 Allergic rhinitis 07/22/2015  . 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 Premature ovarian failure 05/19/2014  . Cardiac conduction disorder 09/24/2008  . 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 Avitaminosis D 09/24/2008    Past Surgical History:  Procedure Laterality Date  . COLONOSCOPY WITH PROPOFOL N/A 11/08/2017   Procedure: COLONOSCOPY WITH PROPOFOL;  Surgeon: Pasty Spillers, MD;  Location: [facility] ENDOSCOPY;  Service: Endoscopy;  Laterality: N/A;  . WISDOM TOOTH EXTRACTION  age 45     Family History        Family Status  Relation Name Status  . Sister  Deceased at age late 61       [place] Syndrome  . Brother  Deceased at age 85       [place] Syndrome  . Brother  Deceased at age late 38       [place] Syndrome  . Mother  Alive  . Father  Deceased       complications of pneumonia and CVA  . Mat Aunt  Deceased  . Mat Uncle  Deceased at age 65       cancer  . Other Murtis Sink Deceased at age 34       [place] Syndrome  . Other Neice Luna Kitchens Deceased at age 105       [place] Syndrome  . Other Nephew Feliz Beam Deceased at age about 71       [place] Syndrome  . Mat Uncle  Deceased  . Neg Hx  (Not Specified)        Her family history includes Cancer in her maternal uncle; Other in her brother, brother, sister, and other family members. There is no history of Breast cancer.      No Known Allergies   Current Outpatient Medications:  .  CALCIUM PO, Take by mouth., Disp: , Rfl:  .  Clobetasol Propionate Emulsion 0.05 % topical foam, APPLY TO THE AFFECTED AREA EVERY NIGHT AT BEDTIME, Disp: , Rfl: 1 .  meloxicam (MOBIC) 7.5 MG tablet, Take 1 tablet (7.5 mg total) by mouth daily., Disp: 30 tablet, Rfl: 0 .  Multiple Vitamin (MULTIVITAMIN) tablet, Take 1 tablet by mouth daily., Disp: , Rfl:  .  Multiple Vitamins-Minerals (HAIR/SKIN/NAILS PO), Take by mouth., Disp: , Rfl:  .  Omega-3 Fatty Acids (FISH OIL PO), Take by mouth., Disp: , Rfl:  .  Vitamin D, Ergocalciferol, (DRISDOL) 1.25 MG (50000 UT) CAPS capsule, TAKE 1 CAPSULE BY MOUTH ONCE EVERY MONTH, Disp: 12 capsule, Rfl: 0 .  aspirin 81 MG tablet, , Disp: , Rfl:    Patient Care Team: Reine Just as PCP - General (Family Medicine)    Objective:    Vitals: BP 110/82   Pulse 66   Temp (!) 96.9 F (36.1 C) (Oral)   Resp 15   Ht 5\' 6"  (1.676 m)   Wt 167 lb 12.8 oz (76.1 kg)   LMP 01/10/2004 (Approximate)   SpO2 99%   BMI 27.08 kg/m    Vitals:   07/21/19 1055  BP: 110/82   Pulse: 66  Resp: 15  Temp: (!) 96.9 F (36.1 C)  TempSrc: Oral  SpO2: 99%  Weight: 167 lb 12.8 oz (76.1 kg)  Height: 5\' 6"  (1.676 m)     Physical Exam Vitals signs reviewed.  Constitutional:      General: She is not in acute distress.    Appearance: Normal appearance. She is well-developed. She is not ill-appearing or diaphoretic.  HENT:     Head: Normocephalic and atraumatic.     Right Ear: Tympanic membrane, ear canal and external ear normal.     Left Ear: Tympanic membrane, ear canal and external ear normal.     Nose: Nose normal.     Mouth/Throat:     Mouth: Mucous membranes are moist.     Pharynx: No oropharyngeal exudate or posterior oropharyngeal erythema.  Eyes:     General: No scleral icterus.       Right eye: No discharge.        Left eye: No discharge.     Extraocular Movements: Extraocular movements intact.     Conjunctiva/sclera: Conjunctivae normal.     Pupils: Pupils are equal, round, and reactive to light.  Neck:     Musculoskeletal: Normal range of motion and neck supple.     Thyroid: No thyromegaly.     Vascular: No carotid bruit or JVD.     Trachea: No tracheal deviation.  Cardiovascular:     Rate and Rhythm: Normal rate and regular rhythm.     Pulses: Normal pulses.     Heart sounds: Normal heart sounds. No murmur. No friction rub. No gallop.   Pulmonary:     Effort: Pulmonary effort is normal. No respiratory distress.     Breath sounds: Normal breath sounds. No wheezing or rales.  Chest:     Chest wall: No tenderness.     Breasts:        Right: Normal. No mass or skin change.        Left: Normal. No mass or skin change.  Abdominal:     General: Abdomen is flat. Bowel sounds are normal. There is no distension.     Palpations: Abdomen is soft. There is no mass.     Tenderness: There is no abdominal tenderness. There is no guarding or rebound.  Musculoskeletal: Normal range of motion.        General: No tenderness.     Right lower leg: No edema.      Left lower leg: No edema.     Comments: Positive phalen test, negative tinel sign  Lymphadenopathy:     Cervical: No cervical adenopathy.     Upper Body:     Right upper body: No supraclavicular, axillary or pectoral adenopathy.     Left upper body: No supraclavicular, axillary or pectoral adenopathy.  Skin:    General: Skin is warm and dry.     Capillary Refill: Capillary refill takes less than 2 seconds.     Findings: No rash.  Neurological:     General: No focal deficit present.     Mental Status: She is alert and oriented to person, place, and time. Mental status is at baseline.  Psychiatric:        Mood and Affect: Mood normal.        Behavior: Behavior normal.        Thought Content: Thought content normal.        Judgment: Judgment normal.      Depression Screen PHQ 2/9 Scores 07/21/2019 07/15/2018 07/03/2017 07/22/2015  PHQ - 2 Score 0 0 0 0  PHQ- 9 Score 0 - 3 -       Assessment & Plan:     Routine Health Maintenance and Physical Exam  Exercise Activities and Dietary recommendations Goals   None     Immunization History  Administered Date(s) Administered  . Hepatitis B 09/11/2013, 10/12/2013, 03/11/2014  . Tdap 05/09/2013  . Typhoid Inactivated 03/11/2014    Health Maintenance  Topic Date Due  . INFLUENZA VACCINE  04/12/2019  . MAMMOGRAM  08/29/2019  . PAP SMEAR-Modifier  07/15/2021  . TETANUS/TDAP  05/10/2023  . COLONOSCOPY  11/09/2027  . Hepatitis C Screening  Completed  . HIV Screening  Completed     Discussed health benefits of physical activity, and encouraged her to engage in regular exercise appropriate for her age and condition.    1. Annual physical exam Normal physical exam today. Will check labs as below and f/u pending lab results. If labs are stable and WNL she will not need to have these rechecked for one year at her next annual physical exam. She is to call the office in the meantime if she has any acute issue, questions or  concerns. - CBC w/Diff/Platelet - Comprehensive Metabolic Panel (CMET) - Lipid Profile - HgB A1c - TSH - Vitamin D (25 hydroxy)  2. Encounter for screening mammogram for malignant neoplasm of breast Breast exam today was normal. There is no family history of breast cancer. She does perform regular self breast exams. Mammogram was ordered as below. Information [facility] was given to patient so she may schedule her mammogram at her convenience. - MM Digital Screening; Future  3. BMI 27.0-27.9,adult Counseled patient on healthy lifestyle modifications including dieting and exercise.  - Comprehensive Metabolic Panel (CMET) - Lipid Profile - HgB A1c  4. Avitaminosis D Will check labs as below and f/u pending results. - Vitamin D (25 hydroxy)  5. Big thyroid Asymptomatic. Will check labs as below and f/u pending results. - TSH  6. Bilateral carpal tunnel syndrome Early signs. Discussed night splints and NSAIDs prn. Call if not improving or worsening.   --------------------------------------------------------------------    Margaretann LovelessJennifer M Shyhiem Beeney, PA-C  [facility]

## 2019-07-21 ENCOUNTER — Other Ambulatory Visit: Payer: Self-pay

## 2019-07-21 ENCOUNTER — Ambulatory Visit (INDEPENDENT_AMBULATORY_CARE_PROVIDER_SITE_OTHER): Payer: BC Managed Care – PPO | Admitting: Physician Assistant

## 2019-07-21 ENCOUNTER — Encounter: Payer: Self-pay | Admitting: Physician Assistant

## 2019-07-21 VITALS — BP 110/82 | HR 66 | Temp 96.9°F | Resp 15 | Ht 66.0 in | Wt 167.8 lb

## 2019-07-21 DIAGNOSIS — G5603 Carpal tunnel syndrome, bilateral upper limbs: Secondary | ICD-10-CM

## 2019-07-21 DIAGNOSIS — Z6827 Body mass index (BMI) 27.0-27.9, adult: Secondary | ICD-10-CM

## 2019-07-21 DIAGNOSIS — Z0000 Encounter for general adult medical examination without abnormal findings: Secondary | ICD-10-CM | POA: Diagnosis not present

## 2019-07-21 DIAGNOSIS — E010 Iodine-deficiency related diffuse (endemic) goiter: Secondary | ICD-10-CM

## 2019-07-21 DIAGNOSIS — E559 Vitamin D deficiency, unspecified: Secondary | ICD-10-CM | POA: Diagnosis not present

## 2019-07-21 DIAGNOSIS — Z1231 Encounter for screening mammogram for malignant neoplasm of breast: Secondary | ICD-10-CM | POA: Diagnosis not present

## 2019-07-21 NOTE — Patient Instructions (Signed)

## 2019-07-22 ENCOUNTER — Telehealth: Payer: Self-pay

## 2019-07-22 LAB — HEMOGLOBIN A1C
Est. average glucose Bld gHb Est-mCnc: 103 mg/dL
Hgb A1c MFr Bld: 5.2 % (ref 4.8–5.6)

## 2019-07-22 LAB — CBC WITH DIFFERENTIAL/PLATELET
Basophils Absolute: 0 10*3/uL (ref 0.0–0.2)
Basos: 1 %
EOS (ABSOLUTE): 0.1 10*3/uL (ref 0.0–0.4)
Eos: 1 %
Hematocrit: 39.6 % (ref 34.0–46.6)
Hemoglobin: 12.9 g/dL (ref 11.1–15.9)
Immature Grans (Abs): 0 10*3/uL (ref 0.0–0.1)
Immature Granulocytes: 0 %
Lymphocytes Absolute: 2.6 10*3/uL (ref 0.7–3.1)
Lymphs: 34 %
MCH: 27.6 pg (ref 26.6–33.0)
MCHC: 32.6 g/dL (ref 31.5–35.7)
MCV: 85 fL (ref 79–97)
Monocytes Absolute: 0.5 10*3/uL (ref 0.1–0.9)
Monocytes: 7 %
Neutrophils Absolute: 4.3 10*3/uL (ref 1.4–7.0)
Neutrophils: 57 %
Platelets: 247 10*3/uL (ref 150–450)
RBC: 4.67 x10E6/uL (ref 3.77–5.28)
RDW: 13.1 % (ref 11.7–15.4)
WBC: 7.5 10*3/uL (ref 3.4–10.8)

## 2019-07-22 LAB — COMPREHENSIVE METABOLIC PANEL
ALT: 18 IU/L (ref 0–32)
AST: 20 IU/L (ref 0–40)
Albumin/Globulin Ratio: 1.6 (ref 1.2–2.2)
Albumin: 4.2 g/dL (ref 3.8–4.9)
Alkaline Phosphatase: 82 IU/L (ref 39–117)
BUN/Creatinine Ratio: 24 — ABNORMAL HIGH (ref 9–23)
BUN: 19 mg/dL (ref 6–24)
Bilirubin Total: 0.4 mg/dL (ref 0.0–1.2)
CO2: 25 mmol/L (ref 20–29)
Calcium: 10.1 mg/dL (ref 8.7–10.2)
Chloride: 104 mmol/L (ref 96–106)
Creatinine, Ser: 0.8 mg/dL (ref 0.57–1.00)
GFR calc Af Amer: 94 mL/min/{1.73_m2} (ref >59)
GFR calc non Af Amer: 82 mL/min/{1.73_m2} (ref >59)
Globulin, Total: 2.7 g/dL (ref 1.5–4.5)
Glucose: 98 mg/dL (ref 65–99)
Potassium: 4.3 mmol/L (ref 3.5–5.2)
Sodium: 142 mmol/L (ref 134–144)
Total Protein: 6.9 g/dL (ref 6.0–8.5)

## 2019-07-22 LAB — TSH: TSH: 1.4 u[IU]/mL (ref 0.450–4.500)

## 2019-07-22 LAB — LIPID PANEL
Chol/HDL Ratio: 2.3 ratio (ref 0.0–4.4)
Cholesterol, Total: 177 mg/dL (ref 100–199)
HDL: 77 mg/dL (ref >39)
LDL Chol Calc (NIH): 83 mg/dL (ref 0–99)
Triglycerides: 92 mg/dL (ref 0–149)
VLDL Cholesterol Cal: 17 mg/dL (ref 5–40)

## 2019-07-22 LAB — VITAMIN D 25 HYDROXY (VIT D DEFICIENCY, FRACTURES): Vit D, 25-Hydroxy: 48.6 ng/mL (ref 30.0–100.0)

## 2019-07-22 NOTE — Telephone Encounter (Signed)
LMTCB 07/22/2019  Thanks,   -Mickel Baas

## 2019-07-22 NOTE — Telephone Encounter (Signed)
-----   Message from Mar Daring, [place] sent at 07/22/2019  1:36 PM EST ----- Blood count is normal. Kidney and liver function are normal. Sodium, potassium and calcium are normal. Cholesterol is normal. A1c is normal. Thyroid is normal. Vit D is normal.

## 2019-07-23 NOTE — Telephone Encounter (Signed)
LMTCB 07/23/19

## 2019-07-24 NOTE — Telephone Encounter (Signed)
Patient advised.

## 2019-07-29 ENCOUNTER — Other Ambulatory Visit: Payer: Self-pay | Admitting: Physician Assistant

## 2019-07-29 DIAGNOSIS — Z1231 Encounter for screening mammogram for malignant neoplasm of breast: Secondary | ICD-10-CM

## 2019-09-01 ENCOUNTER — Ambulatory Visit
Admission: RE | Admit: 2019-09-01 | Discharge: 2019-09-01 | Disposition: A | Discharge status: Home / Self Care | Payer: BC Managed Care – PPO | Source: Ambulatory Visit | Attending: Physician Assistant | Admitting: Physician Assistant

## 2019-09-01 DIAGNOSIS — Z1231 Encounter for screening mammogram for malignant neoplasm of breast: Secondary | ICD-10-CM | POA: Insufficient documentation

## 2019-09-02 ENCOUNTER — Telehealth: Payer: Self-pay

## 2019-09-02 NOTE — Telephone Encounter (Signed)
-----   Message from Mar Daring, [place] sent at 09/02/2019  1:02 PM EST ----- Normal mammogram. Repeat screening in one year.

## 2019-09-02 NOTE — Telephone Encounter (Signed)
Pt advised.   Thanks,   -Mohamedamin Nifong  

## 2020-07-26 ENCOUNTER — Other Ambulatory Visit: Payer: Self-pay

## 2020-07-26 ENCOUNTER — Ambulatory Visit (INDEPENDENT_AMBULATORY_CARE_PROVIDER_SITE_OTHER): Payer: BC Managed Care – PPO | Admitting: Physician Assistant

## 2020-07-26 ENCOUNTER — Encounter: Payer: Self-pay | Admitting: Physician Assistant

## 2020-07-26 VITALS — BP 123/85 | HR 73 | Temp 98.6°F | Resp 16 | Ht 66.0 in | Wt 177.0 lb

## 2020-07-26 DIAGNOSIS — Z0000 Encounter for general adult medical examination without abnormal findings: Secondary | ICD-10-CM

## 2020-07-26 DIAGNOSIS — Z1239 Encounter for other screening for malignant neoplasm of breast: Secondary | ICD-10-CM

## 2020-07-26 DIAGNOSIS — E559 Vitamin D deficiency, unspecified: Secondary | ICD-10-CM

## 2020-07-26 DIAGNOSIS — E010 Iodine-deficiency related diffuse (endemic) goiter: Secondary | ICD-10-CM | POA: Diagnosis not present

## 2020-07-26 DIAGNOSIS — Z6828 Body mass index (BMI) 28.0-28.9, adult: Secondary | ICD-10-CM

## 2020-07-26 NOTE — Progress Notes (Signed)
Complete physical exam   Patient: Emily Roth   DOB: 1961/07/26   59 y.o. Female  MRN: [id number] Visit Date: 07/26/2020  Today's healthcare provider: Margaretann Loveless, PA-C   Chief Complaint  Patient presents with  . Annual Exam   Subjective    Emily Roth is a 59 y.o. female who presents today for a complete physical exam.  She reports consuming a general diet. The patient does not participate in regular exercise at present. She generally feels well. She reports sleeping fairly well. She does not have additional problems to discuss today.  HPI  07-15-18-Pap is normal, HPV negative. Will repeat in 3-5 years. 09-01-19-Normal mammogram. Repeat screening in one year. 11-08-17-Colonoscopy done  Past Medical History:  Diagnosis Date  . Premature ovarian failure 2005   age 3   Past Surgical History:  Procedure Laterality Date  . COLONOSCOPY WITH PROPOFOL N/A 11/08/2017   Procedure: COLONOSCOPY WITH PROPOFOL;  Surgeon: Pasty Spillers, MD;  Location: [facility] ENDOSCOPY;  Service: Endoscopy;  Laterality: N/A;  . WISDOM TOOTH EXTRACTION  age 63   Social History   Socioeconomic History  . Marital status: Single    Spouse name: Not on file  . Number of children: 1  . Years of education: Not on file  . Highest education level: Not on file  Occupational History  . Not on file  Tobacco Use  . Smoking status: Never Smoker  . Smokeless tobacco: Never Used  Vaping Use  . Vaping Use: Never used  Substance and Sexual Activity  . Alcohol use: No    Comment: rare  . Drug use: No  . Sexual activity: Not Currently    Partners: Male    Birth control/protection: Post-menopausal  Other Topics Concern  . Not on file  Social History Narrative   She works in Engineering geologist.  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   Social Determinants of Health   Financial Resource Strain:   . Difficulty of Paying Living Expenses: Not on  file  Food Insecurity:   . Worried About Programme researcher, broadcasting/film/video in the Last Year: Not on file  . Ran Out of Food in the Last Year: Not on file  Transportation Needs:   . Lack of Transportation (Medical): Not on file  . Lack of Transportation (Non-Medical): Not on file  Physical Activity:   . Days of Exercise per Week: Not on file  . Minutes of Exercise per Session: Not on file  Stress:   . Feeling of Stress : Not on file  Social Connections:   . Frequency of Communication with Friends and Family: Not on file  . Frequency of Social Gatherings with Friends and Family: Not on file  . Attends Religious Services: Not on file  . Active Member of Clubs or Organizations: Not on file  . Attends Banker Meetings: Not on file  . Marital Status: Not on file  Intimate Partner Violence:   . Fear of Current or Ex-Partner: Not on file  . Emotionally Abused: Not on file  . Physically Abused: Not on file  . Sexually Abused: Not on file   Family Status  Relation Name Status  . Sister  Deceased at age late 57       [place] Syndrome  . Brother  Deceased at age 64       [place] Syndrome  . Brother  Deceased at  age late 41       [place] Syndrome  . Mother  Alive  . Father  Deceased       complications of pneumonia and CVA  . Mat Aunt  Deceased  . Mat Uncle  Deceased at age 48       cancer  . Other Murtis Sink Deceased at age 52       [place] Syndrome  . Other Neice Luna Kitchens Deceased at age 56       [place] Syndrome  . Other Nephew Feliz Beam Deceased at age about 37       [place] Syndrome  . Mat Uncle  Deceased  . Neg Hx  (Not Specified)   Family History  Problem Relation Age of Onset  . Other Sister        [place] Syndrome  . Other Brother        [place] Syndrome  . Other Brother        [place] Syndrome  . Other Other        [place] Syndrome  . Other Other        [place] Syndrome  . Other Other        nephew - [place]  . Cancer Maternal Uncle         ?  Marland Kitchen Breast cancer Neg Hx    No Known Allergies  Patient Care Team: Reine Just as PCP - General (Family Medicine)   Medications: Outpatient Medications Prior to Visit  Medication Sig  . aspirin 81 MG tablet   . CALCIUM PO Take by mouth.  . Clobetasol Propionate Emulsion 0.05 % topical foam APPLY TO THE AFFECTED AREA EVERY NIGHT AT BEDTIME  . Multiple Vitamin (MULTIVITAMIN) tablet Take 1 tablet by mouth daily.  . Multiple Vitamins-Minerals (HAIR/SKIN/NAILS PO) Take by mouth.  . Omega-3 Fatty Acids (FISH OIL PO) Take by mouth.  . Vitamin D, Ergocalciferol, (DRISDOL) 1.25 MG (50000 UT) CAPS capsule TAKE 1 CAPSULE BY MOUTH ONCE EVERY MONTH  . meloxicam (MOBIC) 7.5 MG tablet Take 1 tablet (7.5 mg total) by mouth daily. (Patient not taking: Reported on 07/26/2020)   No facility-administered medications prior to visit.    Review of Systems  Constitutional: Negative.   HENT: Negative.   Eyes: Negative.   Respiratory: Negative.   Cardiovascular: Negative.   Gastrointestinal: Negative.   Endocrine: Negative.   Genitourinary: Negative.   Musculoskeletal: Negative.   Skin: Negative.   Allergic/Immunologic: Negative.   Neurological: Positive for numbness (hands only in morning).  Hematological: Negative.   Psychiatric/Behavioral: Negative.     Last CBC Lab Results  Component Value Date   WBC 7.5 07/21/2019   HGB 12.9 07/21/2019   HCT 39.6 07/21/2019   MCV 85 07/21/2019   MCH 27.6 07/21/2019   RDW 13.1 07/21/2019   PLT 247 07/21/2019   Last metabolic panel Lab Results  Component Value Date   GLUCOSE 98 07/21/2019   NA 142 07/21/2019   K 4.3 07/21/2019   CL 104 07/21/2019   CO2 25 07/21/2019   BUN 19 07/21/2019   CREATININE 0.80 07/21/2019   GFRNONAA 82 07/21/2019   GFRAA 94 07/21/2019   CALCIUM 10.1 07/21/2019   PROT 6.9 07/21/2019   ALBUMIN 4.2 07/21/2019   LABGLOB 2.7 07/21/2019   AGRATIO 1.6 07/21/2019   BILITOT 0.4 07/21/2019   ALKPHOS 82  07/21/2019   AST 20 07/21/2019   ALT 18 07/21/2019  Objective    BP 123/85   Pulse 73   Temp 98.6 F (37 C)   Resp 16   Ht 5\' 6"  (1.676 m)   Wt 177 lb (80.3 kg)   LMP 01/10/2004 (Approximate)   BMI 28.57 kg/m  BP Readings from Last 3 Encounters:  07/26/20 123/85  07/21/19 110/82  07/15/18 128/70   Wt Readings from Last 3 Encounters:  07/26/20 177 lb (80.3 kg)  07/21/19 167 lb 12.8 oz (76.1 kg)  07/15/18 163 lb 3.2 oz (74 kg)      Physical Exam Vitals reviewed.  Constitutional:      General: She is not in acute distress.    Appearance: Normal appearance. She is well-developed, well-groomed and overweight. She is not ill-appearing or diaphoretic.  HENT:     Head: Normocephalic and atraumatic.     Right Ear: Tympanic membrane, ear canal and external ear normal.     Left Ear: Tympanic membrane, ear canal and external ear normal.  Eyes:     General: No scleral icterus.       Right eye: No discharge.        Left eye: No discharge.     Extraocular Movements: Extraocular movements intact.     Conjunctiva/sclera: Conjunctivae normal.     Pupils: Pupils are equal, round, and reactive to light.  Neck:     Thyroid: No thyromegaly.     Vascular: No carotid bruit or JVD.     Trachea: No tracheal deviation.  Cardiovascular:     Rate and Rhythm: Normal rate and regular rhythm.     Pulses: Normal pulses.     Heart sounds: Normal heart sounds. No murmur heard.  No friction rub. No gallop.   Pulmonary:     Effort: Pulmonary effort is normal. No respiratory distress.     Breath sounds: Normal breath sounds. No wheezing or rales.  Chest:     Chest wall: No tenderness.     Breasts:        Right: Normal.        Left: Normal.  Abdominal:     General: Abdomen is flat. Bowel sounds are normal. There is no distension.     Palpations: Abdomen is soft. There is no mass.     Tenderness: There is no abdominal tenderness. There is no guarding or rebound.  Musculoskeletal:         General: No tenderness. Normal range of motion.     Cervical back: Normal range of motion and neck supple. No tenderness.     Right lower leg: No edema.     Left lower leg: No edema.  Lymphadenopathy:     Cervical: No cervical adenopathy.  Skin:    General: Skin is warm and dry.     Capillary Refill: Capillary refill takes less than 2 seconds.     Findings: No rash.  Neurological:     General: No focal deficit present.     Mental Status: She is alert and oriented to person, place, and time. Mental status is at baseline.  Psychiatric:        Mood and Affect: Mood normal.        Behavior: Behavior normal. Behavior is cooperative.        Thought Content: Thought content normal.        Judgment: Judgment normal.     Last depression screening scores PHQ 2/9 Scores 07/26/2020 07/21/2019 07/15/2018  PHQ - 2 Score 0 0 0  PHQ- 9  Score - 0 -   Last fall risk screening Fall Risk  07/26/2020  Falls in the past year? 0  Number falls in past yr: 0  Injury with Fall? 0  Risk for fall due to : No Fall Risks  Follow up Falls evaluation completed   Last Audit-C alcohol use screening Alcohol Use Disorder Test (AUDIT) 07/26/2020  1. How often do you have a drink containing alcohol? 2  2. How many drinks containing alcohol do you have on a typical day when you are drinking? 0  3. How often do you have six or more drinks on one occasion? 0  AUDIT-C Score 2  Alcohol Brief Interventions/Follow-up AUDIT Score <7 follow-up not indicated   A score of 3 or more in women, and 4 or more in men indicates increased risk for alcohol abuse, EXCEPT if all of the points are from question 1   No results found for any visits on 07/26/20.  Assessment & Plan    Routine Health Maintenance and Physical Exam  Exercise Activities and Dietary recommendations Goals   None     Immunization History  Administered Date(s) Administered  . Hepatitis B 09/11/2013, 10/12/2013, 03/11/2014  . Tdap 05/09/2013  .  Typhoid Inactivated 03/11/2014    Health Maintenance  Topic Date Due  . COVID-19 Vaccine (1) Never done  . INFLUENZA VACCINE  Never done  . MAMMOGRAM  08/31/2020  . PAP SMEAR-Modifier  07/15/2021  . TETANUS/TDAP  05/10/2023  . COLONOSCOPY  11/09/2027  . Hepatitis C Screening  Completed  . HIV Screening  Completed    Discussed health benefits of physical activity, and encouraged her to engage in regular exercise appropriate for her age and condition.  1. Annual physical exam Normal physical exam today. Will check labs as below and f/u pending lab results. If labs are stable and WNL she will not need to have these rechecked for one year at her next annual physical exam. She is to call the office in the meantime if she has any acute issue, questions or concerns.  2. Encounter for breast cancer screening using non-mammogram modality Breast exam today was normal. There is no family history of breast cancer. She does perform regular self breast exams. Mammogram was ordered as below. Information for [facility] was given to patient so she may schedule her mammogram at her convenience.  3. BMI 28.0-28.9,adult Counseled patient on healthy lifestyle modifications including dieting and exercise.  Will check labs as below and f/u pending results. - CBC w/Diff/Platelet - Comprehensive Metabolic Panel (CMET) - Lipid Panel With LDL/HDL Ratio  4. Big thyroid Will check labs as below and f/u pending results. - CBC w/Diff/Platelet - TSH  5. Avitaminosis D Will check labs as below and f/u pending results. Also had premature ovarian failure. - CBC w/Diff/Platelet - Vitamin D (25 hydroxy)   No follow-ups on file.     Delmer Islam, PA-C, have reviewed all documentation for this visit. The documentation on 07/26/20 for the exam, diagnosis, procedures, and orders are all accurate and complete.   Reine Just  [facility] [phone]  (phone) [phone] (fax)  [facility]

## 2020-07-26 NOTE — Patient Instructions (Signed)
[facility] [address] [place],  [place]  [address] Main: [phone]   Preventive Care 40-59 Years Old, Female Preventive care refers to visits with your health care provider and lifestyle choices that can promote health and wellness. This includes:  A yearly physical exam. This may also be called an annual well check.  Regular dental visits and eye exams.  Immunizations.  Screening for certain conditions.  Healthy lifestyle choices, such as eating a healthy diet, getting regular exercise, not using drugs or products that contain nicotine and tobacco, and limiting alcohol use. What can I expect for my preventive care visit? Physical exam Your health care provider will check your:  Height and weight. This may be used to calculate body mass index (BMI), which tells if you are at a healthy weight.  Heart rate and blood pressure.  Skin for abnormal spots. Counseling Your health care provider may ask you questions about your:  Alcohol, tobacco, and drug use.  Emotional well-being.  Home and relationship well-being.  Sexual activity.  Eating habits.  Work and work environment.  Method of birth control.  Menstrual cycle.  Pregnancy history. What immunizations do I need?  Influenza (flu) vaccine  This is recommended every year. Tetanus, diphtheria, and pertussis (Tdap) vaccine  You may need a Td booster every 10 years. Varicella (chickenpox) vaccine  You may need this if you have not been vaccinated. Zoster (shingles) vaccine  You may need this after age 60. Measles, mumps, and rubella (MMR) vaccine  You may need at least one dose of MMR if you were born in 1957 or later. You may also need a second dose. Pneumococcal conjugate (PCV13) vaccine  You may need this if you have certain conditions and were not previously vaccinated. Pneumococcal polysaccharide (PPSV23) vaccine  You may need one or two doses if you smoke  cigarettes or if you have certain conditions. Meningococcal conjugate (MenACWY) vaccine  You may need this if you have certain conditions. Hepatitis A vaccine  You may need this if you have certain conditions or if you travel or work in places where you may be exposed to hepatitis A. Hepatitis B vaccine  You may need this if you have certain conditions or if you travel or work in places where you may be exposed to hepatitis B. Haemophilus influenzae type b (Hib) vaccine  You may need this if you have certain conditions. Human papillomavirus (HPV) vaccine  If recommended by your health care provider, you may need three doses over 6 months. You may receive vaccines as individual doses or as more than one vaccine together in one shot (combination vaccines). Talk with your health care provider about the risks and benefits of combination vaccines. What tests do I need? Blood tests  Lipid and cholesterol levels. These may be checked every 5 years, or more frequently if you are over 50 years old.  Hepatitis C test.  Hepatitis B test. Screening  Lung cancer screening. You may have this screening every year starting at age 55 if you have a 30-pack-year history of smoking and currently smoke or have quit within the past 15 years.  Colorectal cancer screening. All adults should have this screening starting at age 50 and continuing until age 75. Your health care provider may recommend screening at age 45 if you are at increased risk. You will have tests every 1-10 years, depending on your results and the type of screening test.  Diabetes screening. This is   done by checking your blood sugar (glucose) after you have not eaten for a while (fasting). You may have this done every 1-3 years.  Mammogram. This may be done every 1-2 years. Talk with your health care provider about when you should start having regular mammograms. This may depend on whether you have a family history of breast  cancer.  BRCA-related cancer screening. This may be done if you have a family history of breast, ovarian, tubal, or peritoneal cancers.  Pelvic exam and Pap test. This may be done every 3 years starting at age 21. Starting at age 30, this may be done every 5 years if you have a Pap test in combination with an HPV test. Other tests  Sexually transmitted disease (STD) testing.  Bone density scan. This is done to screen for osteoporosis. You may have this scan if you are at high risk for osteoporosis. Follow these instructions at home: Eating and drinking  Eat a diet that includes fresh fruits and vegetables, whole grains, lean protein, and low-fat dairy.  Take vitamin and mineral supplements as recommended by your health care provider.  Do not drink alcohol if: ? Your health care provider tells you not to drink. ? You are pregnant, may be pregnant, or are planning to become pregnant.  If you drink alcohol: ? Limit how much you have to 0-1 drink a day. ? Be aware of how much alcohol is in your drink. In the [place] one drink equals one 12 oz bottle of beer (355 mL), one 5 oz glass of wine (148 mL), or one 1 oz glass of hard liquor (44 mL). Lifestyle  Take daily care of your teeth and gums.  Stay active. Exercise for at least 30 minutes on 5 or more days each week.  Do not use any products that contain nicotine or tobacco, such as cigarettes, e-cigarettes, and chewing tobacco. If you need help quitting, ask your health care provider.  If you are sexually active, practice safe sex. Use a condom or other form of birth control (contraception) in order to prevent pregnancy and STIs (sexually transmitted infections).  If told by your health care provider, take low-dose aspirin daily starting at age 50. What's next?  Visit your health care provider once a year for a well check visit.  Ask your health care provider how often you should have your eyes and teeth checked.  Stay up to date  on all vaccines. This information is not intended to replace advice given to you by your health care provider. Make sure you discuss any questions you have with your health care provider. Document Revised: 05/09/2018 Document Reviewed: 05/09/2018 Elsevier Patient Education  2020 Elsevier Inc.    

## 2020-07-27 LAB — CBC WITH DIFFERENTIAL/PLATELET
Basophils Absolute: 0 10*3/uL (ref 0.0–0.2)
Basos: 0 %
EOS (ABSOLUTE): 0.1 10*3/uL (ref 0.0–0.4)
Eos: 2 %
Hematocrit: 42.5 % (ref 34.0–46.6)
Hemoglobin: 13.8 g/dL (ref 11.1–15.9)
Immature Grans (Abs): 0 10*3/uL (ref 0.0–0.1)
Immature Granulocytes: 0 %
Lymphocytes Absolute: 2.3 10*3/uL (ref 0.7–3.1)
Lymphs: 31 %
MCH: 27.7 pg (ref 26.6–33.0)
MCHC: 32.5 g/dL (ref 31.5–35.7)
MCV: 85 fL (ref 79–97)
Monocytes Absolute: 0.6 10*3/uL (ref 0.1–0.9)
Monocytes: 9 %
Neutrophils Absolute: 4.2 10*3/uL (ref 1.4–7.0)
Neutrophils: 58 %
Platelets: 292 10*3/uL (ref 150–450)
RBC: 4.98 x10E6/uL (ref 3.77–5.28)
RDW: 12.5 % (ref 11.7–15.4)
WBC: 7.2 10*3/uL (ref 3.4–10.8)

## 2020-07-27 LAB — LIPID PANEL WITH LDL/HDL RATIO
Cholesterol, Total: 195 mg/dL (ref 100–199)
HDL: 79 mg/dL (ref >39)
LDL Chol Calc (NIH): 104 mg/dL — ABNORMAL HIGH (ref 0–99)
LDL/HDL Ratio: 1.3 ratio (ref 0.0–3.2)
Triglycerides: 63 mg/dL (ref 0–149)
VLDL Cholesterol Cal: 12 mg/dL (ref 5–40)

## 2020-07-27 LAB — COMPREHENSIVE METABOLIC PANEL
ALT: 23 IU/L (ref 0–32)
AST: 19 IU/L (ref 0–40)
Albumin/Globulin Ratio: 1.7 (ref 1.2–2.2)
Albumin: 4.6 g/dL (ref 3.8–4.9)
Alkaline Phosphatase: 78 IU/L (ref 44–121)
BUN/Creatinine Ratio: 19 (ref 9–23)
BUN: 16 mg/dL (ref 6–24)
Bilirubin Total: 0.5 mg/dL (ref 0.0–1.2)
CO2: 25 mmol/L (ref 20–29)
Calcium: 10 mg/dL (ref 8.7–10.2)
Chloride: 102 mmol/L (ref 96–106)
Creatinine, Ser: 0.83 mg/dL (ref 0.57–1.00)
GFR calc Af Amer: 89 mL/min/{1.73_m2} (ref >59)
GFR calc non Af Amer: 77 mL/min/{1.73_m2} (ref >59)
Globulin, Total: 2.7 g/dL (ref 1.5–4.5)
Glucose: 86 mg/dL (ref 65–99)
Potassium: 4.5 mmol/L (ref 3.5–5.2)
Sodium: 138 mmol/L (ref 134–144)
Total Protein: 7.3 g/dL (ref 6.0–8.5)

## 2020-07-27 LAB — TSH: TSH: 1.48 u[IU]/mL (ref 0.450–4.500)

## 2020-07-27 LAB — VITAMIN D 25 HYDROXY (VIT D DEFICIENCY, FRACTURES): Vit D, 25-Hydroxy: 43.9 ng/mL (ref 30.0–100.0)

## 2020-08-02 ENCOUNTER — Telehealth: Payer: Self-pay | Admitting: *Deleted

## 2020-08-02 NOTE — Telephone Encounter (Signed)
Patient returned call- notified of lab results: Blood count is normal. Kidney and liver function are normal. Sodium, potassium, and calcium are normal. Cholesterol is normal, did increase some from last year. Thyroid is normal. Vit d is normal.

## 2020-09-01 ENCOUNTER — Other Ambulatory Visit: Payer: Self-pay

## 2020-09-01 ENCOUNTER — Ambulatory Visit
Admission: RE | Admit: 2020-09-01 | Discharge: 2020-09-01 | Disposition: A | Discharge status: Home / Self Care | Payer: BC Managed Care – PPO | Source: Ambulatory Visit | Attending: Physician Assistant | Admitting: Physician Assistant

## 2020-09-01 DIAGNOSIS — Z1239 Encounter for other screening for malignant neoplasm of breast: Secondary | ICD-10-CM

## 2020-09-01 DIAGNOSIS — Z1231 Encounter for screening mammogram for malignant neoplasm of breast: Secondary | ICD-10-CM | POA: Diagnosis not present

## 2021-07-26 DIAGNOSIS — M25562 Pain in left knee: Secondary | ICD-10-CM | POA: Diagnosis not present

## 2021-07-26 DIAGNOSIS — M1712 Unilateral primary osteoarthritis, left knee: Secondary | ICD-10-CM | POA: Diagnosis not present

## 2021-07-28 ENCOUNTER — Ambulatory Visit (INDEPENDENT_AMBULATORY_CARE_PROVIDER_SITE_OTHER): Payer: BC Managed Care – PPO | Admitting: Physician Assistant

## 2021-07-28 ENCOUNTER — Encounter: Payer: Self-pay | Admitting: Physician Assistant

## 2021-07-28 ENCOUNTER — Other Ambulatory Visit: Payer: Self-pay

## 2021-07-28 ENCOUNTER — Encounter: Payer: BC Managed Care – PPO | Admitting: Family Medicine

## 2021-07-28 VITALS — BP 135/89 | HR 60 | Temp 98.9°F | Ht 65.0 in | Wt 164.0 lb

## 2021-07-28 DIAGNOSIS — N3946 Mixed incontinence: Secondary | ICD-10-CM | POA: Diagnosis not present

## 2021-07-28 DIAGNOSIS — E010 Iodine-deficiency related diffuse (endemic) goiter: Secondary | ICD-10-CM | POA: Diagnosis not present

## 2021-07-28 DIAGNOSIS — E559 Vitamin D deficiency, unspecified: Secondary | ICD-10-CM

## 2021-07-28 DIAGNOSIS — Z0000 Encounter for general adult medical examination without abnormal findings: Secondary | ICD-10-CM | POA: Diagnosis not present

## 2021-07-28 NOTE — Assessment & Plan Note (Signed)
Provided general information and reassurance for urge-type urinary incontinence  Follow up at next physical with further information and education.

## 2021-07-28 NOTE — Patient Instructions (Signed)
Annual Labwork to be collected today  Follow up in 1 year for annual physical and PAP smear in 2024 Mammogram to be scheduled for Dec

## 2021-07-28 NOTE — Assessment & Plan Note (Addendum)
Patient has received her annual flu shot She is going to schedule Mammogram at [facility] in Dec Annual labwork to include TSH, CBC, CMP, Vit D and lipid panel.  Pap smear and pelvic exam due in 2024  Information on Shingles vaccine provided today Information on advance care planning provided for patient education  Performed PHQ9 and frailty assessment today.

## 2021-07-28 NOTE — Progress Notes (Signed)
Complete physical exam   Patient: Emily Roth   DOB: August 25, 1961   60 y.o. Female  MRN: [id number] Visit Date: 07/28/2021  Today's healthcare provider: Almon Register, PA-C   Chief Complaint  Patient presents with   Annual Exam   Introduced myself to the patient as a PA-C and provided education on APPs in clinical practice.   Subjective    Emily Roth is a 60 y.o. female who presents today for a complete physical exam.  She reports consuming a general diet. The patient has a physically strenuous job, but has no regular exercise apart from work.  She generally feels well. She reports sleeping well. She does not have additional problems to discuss today.  HPI   Recently saw Dr. Eliberto Ivory at [place] for left knee aspiration which is helping her discomfort.   Patient has received annual influenza vaccine   Pap smear and pelvic exams have been normal as of 2019 and is due 2024.   Colonscopy due in 2029  Mammogram to be scheduled at [facility] in Dec when she is able   She reports concerns for hair loss that is not resolving even with OTC supplements. Denies itching, rash or pain in area.    Past Medical History:  Diagnosis Date   Premature ovarian failure 2005   age 46   Past Surgical History:  Procedure Laterality Date   COLONOSCOPY WITH PROPOFOL N/A 11/08/2017   Procedure: COLONOSCOPY WITH PROPOFOL;  Surgeon: Virgel Manifold, MD;  Location: [facility] ENDOSCOPY;  Service: Endoscopy;  Laterality: N/A;   WISDOM TOOTH EXTRACTION  age 75   Social History   Socioeconomic History   Marital status: Single    Spouse name: Not on file   Number of children: 1   Years of education: Not on file   Highest education level: Not on file  Occupational History   Not on file  Tobacco Use   Smoking status: Never   Smokeless tobacco: Never  Vaping Use   Vaping Use: Never used  Substance and Sexual Activity   Alcohol use: No    Comment: rare   Drug use: No   Sexual activity:  Not Currently    Partners: Male    Birth control/protection: Post-menopausal  Other Topics Concern   Not on file  Social History Narrative   She works in Scientist, research (medical).  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   Social Determinants of Health   Financial Resource Strain: Not on file  Food Insecurity: Not on file  Transportation Needs: Not on file  Physical Activity: Not on file  Stress: Not on file  Social Connections: Not on file  Intimate Partner Violence: Not on file   Family Status  Relation Name Status   Sister  Deceased at age late 48       [place] Syndrome   Brother  Deceased at age 29       [place] Syndrome   Brother  Deceased at age late 87       [place] Syndrome   Mother  Alive   Father  Deceased       complications of pneumonia and CVA   Mat Aunt  Deceased   Mat Uncle  Deceased at age 25       cancer   Other Nephew Tyson Deceased at age 56       [place] Syndrome   Other  Lora Havens Deceased at age 71       [place] Syndrome   Other [place] Deceased at age about 64       [place]  Deceased   Neg Hx  (Not Specified)   Family History  Problem Relation Age of Onset   Other Sister        [place] Syndrome   Other Other        [place]   Other Other        [place] Syndrome   Other Other        nephew - [place]   Cancer Maternal Uncle        ?   Breast cancer Neg Hx    No Known Allergies  Patient Care Team: Gwyneth Sprout, FNP as PCP - General (Family Medicine)   Medications: Outpatient Medications Prior to Visit  Medication Sig   aspirin 81 MG tablet    CALCIUM PO Take by mouth.   Clobetasol Propionate Emulsion 0.05 % topical foam APPLY TO THE AFFECTED AREA EVERY NIGHT AT BEDTIME   Multiple Vitamin (MULTIVITAMIN) tablet Take 1 tablet by mouth daily.   Multiple  Vitamins-Minerals (HAIR/SKIN/NAILS PO) Take by mouth.   Omega-3 Fatty Acids (FISH OIL PO) Take by mouth.   Vitamin D, Ergocalciferol, (DRISDOL) 1.25 MG (50000 UT) CAPS capsule TAKE 1 CAPSULE BY MOUTH ONCE EVERY MONTH   meloxicam (MOBIC) 7.5 MG tablet Take 1 tablet (7.5 mg total) by mouth daily.   No facility-administered medications prior to visit.    Review of Systems  Constitutional: Negative.   HENT: Negative.    Eyes: Negative.   Respiratory: Negative.    Cardiovascular: Negative.   Gastrointestinal: Negative.   Endocrine: Negative.   Genitourinary:  Positive for enuresis. Negative for decreased urine volume, difficulty urinating, dyspareunia, dysuria, flank pain, frequency, genital sores, hematuria, menstrual problem, pelvic pain, urgency, vaginal bleeding, vaginal discharge and vaginal pain.  Musculoskeletal: Negative.   Skin:        Patient reports hair loss on left scalp    Allergic/Immunologic: Negative.   Neurological: Negative.   Hematological: Negative.   Psychiatric/Behavioral: Negative.       Objective    BP 135/89 (BP Location: Right Arm, Patient Position: Sitting, Cuff Size: Large)   Pulse 60   Temp 98.9 F (37.2 C) (Oral)   Ht 5\' 5"  (1.651 m)   Wt 164 lb (74.4 kg)   LMP 01/10/2004 (Approximate)   SpO2 100%   BMI 27.29 kg/m    Physical Exam Constitutional:      General: She is awake.     Appearance: Normal appearance.  HENT:     Head: Normocephalic.     Right Ear: Tympanic membrane normal.     Left Ear: Tympanic membrane normal.     Mouth/Throat:     Mouth: Mucous membranes are moist.     Pharynx: Oropharynx is clear.  Eyes:     Extraocular Movements: Extraocular movements intact.     Pupils: Pupils are equal, round, and reactive to light.  Cardiovascular:     Rate and Rhythm: Normal rate and regular rhythm.     Pulses: Normal pulses.  Pulmonary:     Effort: Pulmonary effort is normal.     Breath  sounds: Normal breath sounds.  Abdominal:      General: Bowel sounds are normal.     Palpations: Abdomen is soft.  Musculoskeletal:     Cervical back: Normal range of motion and neck supple.     Right lower leg: No edema.     Left lower leg: No edema.  Lymphadenopathy:     Head:     Right side of head: No submental or submandibular adenopathy.     Left side of head: No submental or submandibular adenopathy.     Upper Body:     Right upper body: No supraclavicular adenopathy.     Left upper body: No supraclavicular adenopathy.  Skin:    General: Skin is warm and dry.     Comments: Mild hair loss and baldness on left temple. No scales, rash or erythema noted.   Neurological:     Mental Status: She is alert and oriented to person, place, and time.  Psychiatric:        Mood and Affect: Mood normal.        Behavior: Behavior normal. Behavior is cooperative.        Thought Content: Thought content normal.     Last depression screening scores PHQ 2/9 Scores 07/28/2021 07/26/2020 07/21/2019  PHQ - 2 Score 0 0 0  PHQ- 9 Score 0 - 0   Last fall risk screening Fall Risk  07/28/2021  Falls in the past year? 0  Number falls in past yr: 0  Injury with Fall? 0  Risk for fall due to : No Fall Risks  Follow up Falls evaluation completed   Last Audit-C alcohol use screening Alcohol Use Disorder Test (AUDIT) 07/28/2021  1. How often do you have a drink containing alcohol? 2  2. How many drinks containing alcohol do you have on a typical day when you are drinking? 0  3. How often do you have six or more drinks on one occasion? 0  AUDIT-C Score 2  Alcohol Brief Interventions/Follow-up -   A score of 3 or more in women, and 4 or more in men indicates increased risk for alcohol abuse, EXCEPT if all of the points are from question 1   No results found for any visits on 07/28/21.  Assessment & Plan    Routine Health Maintenance and Physical Exam  Exercise Activities and Dietary recommendations  Goals   None     Immunization  History  Administered Date(s) Administered   Hepatitis B 09/11/2013, 10/12/2013, 03/11/2014   Hepatitis B, adult 10/24/2013, 02/20/2014   Influenza-Unspecified 07/04/2021   Tdap 05/09/2013   Typhoid Inactivated 03/11/2014    Health Maintenance  Topic Date Due   COVID-19 Vaccine (1) Never done   Zoster Vaccines- Shingrix (1 of 2) Never done   MAMMOGRAM  09/01/2021   TETANUS/TDAP  05/10/2023   PAP SMEAR-Modifier  07/16/2023   COLONOSCOPY (Pts 45-43yrs Insurance coverage will need to be confirmed)  11/09/2027   INFLUENZA VACCINE  Completed   Hepatitis C Screening  Completed   HIV Screening  Completed   Pneumococcal Vaccine 70-68 Years old  Aged Out   HPV VACCINES  Aged Out    Discussed health benefits of physical activity, and encouraged her to engage in regular exercise appropriate for her age and condition.   Problem List Items Addressed This Visit       Endocrine   Big thyroid   Relevant Orders   TSH     Other   Avitaminosis D  Relevant Orders   Vitamin D (25 hydroxy)   Encounter for annual physical exam - Primary    Patient has received her annual flu shot She is going to schedule Mammogram at [facility] in Dec Annual labwork to include TSH, CBC, CMP, Vit D and lipid panel.  Pap smear and pelvic exam due in 2024  Information on Shingles vaccine provided today Information on advance care planning provided for patient education  Performed PHQ9 and frailty assessment today.        Relevant Orders   Lipid panel   Comprehensive Metabolic Panel (CMET)   TSH   Vitamin D (25 hydroxy)   CBC w/Diff/Platelet   Mixed stress and urge urinary incontinence    Provided general information and reassurance for urge-type urinary incontinence  Follow up at next physical with further information and education.        No follow-ups on file.   The entirety of the information documented in the History of Present Illness, Review of Systems and Physical Exam were personally  obtained by me. Portions of this information were initially documented by CMA and reviewed by me for thoroughness and accuracy.    Carole Deere, Mirian Mo MPH [facility]   No follow-ups on file.       Providence Crosby, PA-C  [facility] [employer] [facility] [phone] (phone) [phone] (fax)  [facility]

## 2021-07-29 ENCOUNTER — Other Ambulatory Visit: Payer: Self-pay | Admitting: Physician Assistant

## 2021-07-29 DIAGNOSIS — Z0000 Encounter for general adult medical examination without abnormal findings: Secondary | ICD-10-CM | POA: Diagnosis not present

## 2021-07-29 DIAGNOSIS — E559 Vitamin D deficiency, unspecified: Secondary | ICD-10-CM | POA: Diagnosis not present

## 2021-07-29 DIAGNOSIS — E010 Iodine-deficiency related diffuse (endemic) goiter: Secondary | ICD-10-CM | POA: Diagnosis not present

## 2021-07-30 LAB — CBC WITH DIFFERENTIAL/PLATELET
Basophils Absolute: 0 10*3/uL (ref 0.0–0.2)
Basos: 0 %
EOS (ABSOLUTE): 0 10*3/uL (ref 0.0–0.4)
Eos: 0 %
Hematocrit: 43.7 % (ref 34.0–46.6)
Hemoglobin: 14.3 g/dL (ref 11.1–15.9)
Immature Grans (Abs): 0 10*3/uL (ref 0.0–0.1)
Immature Granulocytes: 0 %
Lymphocytes Absolute: 1.5 10*3/uL (ref 0.7–3.1)
Lymphs: 17 %
MCH: 27.8 pg (ref 26.6–33.0)
MCHC: 32.7 g/dL (ref 31.5–35.7)
MCV: 85 fL (ref 79–97)
Monocytes Absolute: 0.5 10*3/uL (ref 0.1–0.9)
Monocytes: 6 %
Neutrophils Absolute: 6.8 10*3/uL (ref 1.4–7.0)
Neutrophils: 77 %
Platelets: 320 10*3/uL (ref 150–450)
RBC: 5.14 x10E6/uL (ref 3.77–5.28)
RDW: 12.2 % (ref 11.7–15.4)
WBC: 8.9 10*3/uL (ref 3.4–10.8)

## 2021-07-30 LAB — COMPREHENSIVE METABOLIC PANEL
ALT: 20 IU/L (ref 0–32)
AST: 23 IU/L (ref 0–40)
Albumin/Globulin Ratio: 1.7 (ref 1.2–2.2)
Albumin: 4.7 g/dL (ref 3.8–4.9)
Alkaline Phosphatase: 80 IU/L (ref 44–121)
BUN/Creatinine Ratio: 24 (ref 12–28)
BUN: 23 mg/dL (ref 8–27)
Bilirubin Total: 0.7 mg/dL (ref 0.0–1.2)
CO2: 23 mmol/L (ref 20–29)
Calcium: 10.2 mg/dL (ref 8.7–10.3)
Chloride: 104 mmol/L (ref 96–106)
Creatinine, Ser: 0.96 mg/dL (ref 0.57–1.00)
Globulin, Total: 2.7 g/dL (ref 1.5–4.5)
Glucose: 82 mg/dL (ref 70–99)
Potassium: 4.9 mmol/L (ref 3.5–5.2)
Sodium: 143 mmol/L (ref 134–144)
Total Protein: 7.4 g/dL (ref 6.0–8.5)
eGFR: 68 mL/min/{1.73_m2} (ref >59)

## 2021-07-30 LAB — LIPID PANEL
Chol/HDL Ratio: 2.2 ratio (ref 0.0–4.4)
Cholesterol, Total: 199 mg/dL (ref 100–199)
HDL: 90 mg/dL (ref >39)
LDL Chol Calc (NIH): 99 mg/dL (ref 0–99)
Triglycerides: 52 mg/dL (ref 0–149)
VLDL Cholesterol Cal: 10 mg/dL (ref 5–40)

## 2021-07-30 LAB — VITAMIN D 25 HYDROXY (VIT D DEFICIENCY, FRACTURES): Vit D, 25-Hydroxy: 38.7 ng/mL (ref 30.0–100.0)

## 2021-07-30 LAB — TSH: TSH: 0.838 u[IU]/mL (ref 0.450–4.500)

## 2021-08-01 ENCOUNTER — Other Ambulatory Visit: Payer: Self-pay | Admitting: Physician Assistant

## 2021-08-01 DIAGNOSIS — Z1231 Encounter for screening mammogram for malignant neoplasm of breast: Secondary | ICD-10-CM

## 2021-09-02 ENCOUNTER — Other Ambulatory Visit: Payer: Self-pay

## 2021-09-02 ENCOUNTER — Ambulatory Visit
Admission: RE | Admit: 2021-09-02 | Discharge: 2021-09-02 | Disposition: A | Discharge status: Home / Self Care | Payer: BC Managed Care – PPO | Source: Ambulatory Visit | Attending: Physician Assistant | Admitting: Physician Assistant

## 2021-09-02 DIAGNOSIS — Z1231 Encounter for screening mammogram for malignant neoplasm of breast: Secondary | ICD-10-CM | POA: Diagnosis not present

## 2021-09-08 ENCOUNTER — Other Ambulatory Visit: Payer: Self-pay | Admitting: Physician Assistant

## 2021-09-08 DIAGNOSIS — S83242A Other tear of medial meniscus, current injury, left knee, initial encounter: Secondary | ICD-10-CM

## 2021-09-24 ENCOUNTER — Ambulatory Visit
Admission: RE | Admit: 2021-09-24 | Discharge: 2021-09-24 | Disposition: A | Discharge status: Home / Self Care | Payer: BC Managed Care – PPO | Source: Ambulatory Visit | Attending: Physician Assistant | Admitting: Physician Assistant

## 2021-09-24 DIAGNOSIS — S83282A Other tear of lateral meniscus, current injury, left knee, initial encounter: Secondary | ICD-10-CM | POA: Diagnosis not present

## 2021-09-24 DIAGNOSIS — M25462 Effusion, left knee: Secondary | ICD-10-CM | POA: Diagnosis not present

## 2021-09-24 DIAGNOSIS — S83242A Other tear of medial meniscus, current injury, left knee, initial encounter: Secondary | ICD-10-CM | POA: Insufficient documentation

## 2021-10-07 DIAGNOSIS — M1712 Unilateral primary osteoarthritis, left knee: Secondary | ICD-10-CM | POA: Diagnosis not present

## 2021-10-24 DIAGNOSIS — M25562 Pain in left knee: Secondary | ICD-10-CM | POA: Diagnosis not present

## 2021-10-31 DIAGNOSIS — L648 Other androgenic alopecia: Secondary | ICD-10-CM | POA: Diagnosis not present

## 2021-10-31 DIAGNOSIS — Z7689 Persons encountering health services in other specified circumstances: Secondary | ICD-10-CM | POA: Diagnosis not present

## 2021-10-31 DIAGNOSIS — Z79899 Other long term (current) drug therapy: Secondary | ICD-10-CM | POA: Diagnosis not present

## 2021-11-04 DIAGNOSIS — M25562 Pain in left knee: Secondary | ICD-10-CM | POA: Diagnosis not present

## 2021-11-14 DIAGNOSIS — M25562 Pain in left knee: Secondary | ICD-10-CM | POA: Diagnosis not present

## 2021-11-28 DIAGNOSIS — M25562 Pain in left knee: Secondary | ICD-10-CM | POA: Diagnosis not present

## 2021-12-05 DIAGNOSIS — M25562 Pain in left knee: Secondary | ICD-10-CM | POA: Diagnosis not present

## 2021-12-07 DIAGNOSIS — M1712 Unilateral primary osteoarthritis, left knee: Secondary | ICD-10-CM | POA: Diagnosis not present

## 2021-12-07 DIAGNOSIS — M25562 Pain in left knee: Secondary | ICD-10-CM | POA: Diagnosis not present

## 2022-01-30 DIAGNOSIS — L648 Other androgenic alopecia: Secondary | ICD-10-CM | POA: Diagnosis not present

## 2022-01-30 DIAGNOSIS — B360 Pityriasis versicolor: Secondary | ICD-10-CM | POA: Diagnosis not present

## 2022-01-30 DIAGNOSIS — Z792 Long term (current) use of antibiotics: Secondary | ICD-10-CM | POA: Diagnosis not present

## 2022-07-31 ENCOUNTER — Ambulatory Visit (INDEPENDENT_AMBULATORY_CARE_PROVIDER_SITE_OTHER): Payer: BC Managed Care – PPO | Admitting: Physician Assistant

## 2022-07-31 ENCOUNTER — Encounter: Payer: Self-pay | Admitting: Physician Assistant

## 2022-07-31 VITALS — BP 116/81 | HR 78 | Wt 166.4 lb

## 2022-07-31 DIAGNOSIS — Z0000 Encounter for general adult medical examination without abnormal findings: Secondary | ICD-10-CM

## 2022-07-31 DIAGNOSIS — Z1231 Encounter for screening mammogram for malignant neoplasm of breast: Secondary | ICD-10-CM

## 2022-07-31 DIAGNOSIS — E559 Vitamin D deficiency, unspecified: Secondary | ICD-10-CM | POA: Diagnosis not present

## 2022-07-31 NOTE — Progress Notes (Signed)
I,Sha'taria Tyson,acting as a Neurosurgeon for [employer], PA-C.,have documented all relevant documentation on the behalf of Alfredia Ferguson, PA-C,as directed by  Alfredia Ferguson, PA-C while in the presence of Alfredia Ferguson, PA-C.   Complete physical exam   Patient: Emily Roth   DOB: 26-Mar-1961   61 y.o. Female  MRN: [id number] Visit Date: 07/31/2022  Today's healthcare provider: Alfredia Ferguson, PA-C   Cc. Cpe  Subjective    Talula L Abila is a 61 y.o. female who presents today for a complete physical exam.  She reports consuming a general diet. The patient does not participate in regular exercise at present. She generally feels well. She reports sleeping fairly well. She does not have additional problems to discuss today.    Past Medical History:  Diagnosis Date   Premature ovarian failure 2005   age 7   Past Surgical History:  Procedure Laterality Date   COLONOSCOPY WITH PROPOFOL N/A 11/08/2017   Procedure: COLONOSCOPY WITH PROPOFOL;  Surgeon: Pasty Spillers, MD;  Location: [facility] ENDOSCOPY;  Service: Endoscopy;  Laterality: N/A;   WISDOM TOOTH EXTRACTION  age 56   Social History   Socioeconomic History   Marital status: Single    Spouse name: Not on file   Number of children: 1   Years of education: Not on file   Highest education level: Not on file  Occupational History   Not on file  Tobacco Use   Smoking status: Never   Smokeless tobacco: Never  Vaping Use   Vaping Use: Never used  Substance and Sexual Activity   Alcohol use: No    Comment: rare   Drug use: No   Sexual activity: Not Currently    Partners: Male    Birth control/protection: Post-menopausal  Other Topics Concern   Not on file  Social History Narrative   She works in Engineering geologist.  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   Social Determinants of Health   Financial Resource Strain: Not on file  Food Insecurity: Not on file   Transportation Needs: Not on file  Physical Activity: Not on file  Stress: Not on file  Social Connections: Not on file  Intimate Partner Violence: Not on file   Family Status  Relation Name Status   Sister  Deceased at age late 22       [place] Syndrome   Brother  Deceased at age 54       [place] Syndrome   Brother  Deceased at age late 70       [place] Syndrome   Mother  Alive   Father  Deceased       complications of pneumonia and CVA   Mat Aunt  Deceased   Mat Uncle  Deceased at age 65       cancer   Other Nephew Tyson Deceased at age 56       [place] Syndrome   Other Harlin Heys Deceased at age 73       [place] Syndrome   Other Nephew Feliz Beam Deceased at age about 16       [place] Syndrome   Mat Uncle  Deceased   Neg Hx  (Not Specified)   Family History  Problem Relation Age of Onset   Other Sister        [place] Syndrome   Other Brother        [place]  Syndrome   Other Brother        [place] Syndrome   Other Other        [place] Syndrome   Other Other        [place] Syndrome   Other Other        nephew - [place]   Cancer Maternal Uncle        ?   Breast cancer Neg Hx    No Known Allergies  Patient Care Team: Burnett Corrente as PCP - General (Physician Assistant)   Medications: Outpatient Medications Prior to Visit  Medication Sig   aspirin 81 MG tablet    CALCIUM PO Take by mouth.   Clobetasol Propionate Emulsion 0.05 % topical foam APPLY TO THE AFFECTED AREA EVERY NIGHT AT BEDTIME   meloxicam (MOBIC) 7.5 MG tablet Take 1 tablet (7.5 mg total) by mouth daily.   Multiple Vitamin (MULTIVITAMIN) tablet Take 1 tablet by mouth daily.   Multiple Vitamins-Minerals (HAIR/SKIN/NAILS PO) Take by mouth.   Omega-3 Fatty Acids (FISH OIL PO) Take by mouth.   Vitamin D, Ergocalciferol, (DRISDOL) 1.25 MG (50000 UT) CAPS capsule TAKE 1 CAPSULE BY MOUTH ONCE EVERY MONTH   No facility-administered medications prior to visit.     Review of Systems  Constitutional:  Negative for fatigue and fever.  Respiratory:  Negative for cough and shortness of breath.   Cardiovascular:  Negative for chest pain and leg swelling.  Gastrointestinal:  Negative for abdominal pain.  Neurological:  Negative for dizziness and headaches.      Objective    BP 116/81 (BP Location: Left Arm, Patient Position: Sitting, Cuff Size: Large)   Pulse 78   Wt 166 lb 6.4 oz (75.5 kg)   LMP 01/10/2004 (Approximate)   SpO2 100%   BMI 27.69 kg/m  Blood pressure 116/81, pulse 78, weight 166 lb 6.4 oz (75.5 kg), last menstrual period 01/10/2004, SpO2 100 %.    Physical Exam Constitutional:      General: She is awake.     Appearance: She is well-developed. She is not ill-appearing.  HENT:     Head: Normocephalic.     Right Ear: Tympanic membrane normal.     Left Ear: Tympanic membrane normal.     Nose: Nose normal. No congestion or rhinorrhea.     Mouth/Throat:     Pharynx: No oropharyngeal exudate or posterior oropharyngeal erythema.  Eyes:     Conjunctiva/sclera: Conjunctivae normal.     Pupils: Pupils are equal, round, and reactive to light.  Neck:     Thyroid: No thyroid mass or thyromegaly.  Cardiovascular:     Rate and Rhythm: Normal rate and regular rhythm.     Heart sounds: Normal heart sounds.  Pulmonary:     Effort: Pulmonary effort is normal.     Breath sounds: Normal breath sounds.  Abdominal:     Palpations: Abdomen is soft.     Tenderness: There is no abdominal tenderness.  Musculoskeletal:     Right lower leg: No swelling. No edema.     Left lower leg: No swelling. No edema.  Lymphadenopathy:     Cervical: No cervical adenopathy.  Skin:    General: Skin is warm.  Neurological:     Mental Status: She is alert and oriented to person, place, and time.  Psychiatric:        Attention and Perception: Attention normal.        Mood and Affect: Mood normal.  Speech: Speech normal.        Behavior: Behavior  normal. Behavior is cooperative.     Last depression screening scores    07/31/2022   11:12 AM 07/28/2021    9:55 AM 07/26/2020    4:07 PM  PHQ 2/9 Scores  PHQ - 2 Score 0 0 0  PHQ- 9 Score 0 0    Last fall risk screening    07/31/2022   11:12 AM  Fall Risk   Falls in the past year? 0  Number falls in past yr: 0  Injury with Fall? 0  Risk for fall due to : No Fall Risks  Follow up Falls evaluation completed   Last Audit-C alcohol use screening    07/31/2022   11:12 AM  Alcohol Use Disorder Test (AUDIT)  1. How often do you have a drink containing alcohol? 1  2. How many drinks containing alcohol do you have on a typical day when you are drinking? 0  3. How often do you have six or more drinks on one occasion? 0  AUDIT-C Score 1   A score of 3 or more in women, and 4 or more in men indicates increased risk for alcohol abuse, EXCEPT if all of the points are from question 1   No results found for any visits on 07/31/22.  Assessment & Plan    Routine Health Maintenance and Physical Exam  Exercise Activities and Dietary recommendations --balanced diet high in fiber and protein, low in sugars, carbs, fats. --physical activity/exercise 30 minutes 3-5 times a week     Immunization History  Administered Date(s) Administered   Hepatitis B 09/11/2013, 10/12/2013, 03/11/2014   Hepatitis B, adult 10/24/2013, 02/20/2014   Influenza,inj,Quad PF,6+ Mos 06/15/2022   Influenza-Unspecified 07/04/2021   Tdap 05/09/2013   Typhoid Inactivated 03/11/2014    Health Maintenance  Topic Date Due   COVID-19 Vaccine (1) Never done   Zoster Vaccines- Shingrix (1 of 2) 10/31/2022 (Originally 06/21/2011)   MAMMOGRAM  09/02/2022   PAP SMEAR-Modifier  07/16/2023   COLONOSCOPY (Pts 45-76yrs Insurance coverage will need to be confirmed)  11/09/2027   INFLUENZA VACCINE  Completed   Hepatitis C Screening  Completed   HIV Screening  Completed   HPV VACCINES  Aged Out    Discussed health  benefits of physical activity, and encouraged her to engage in regular exercise appropriate for her age and condition.  Problem List Items Addressed This Visit       Other   Avitaminosis D   Relevant Orders   Vitamin D (25 hydroxy)   Encounter for annual physical exam - Primary    Pt reports being up to date on flu vaccine. Ordered mammogram due 12/23       Relevant Orders   CBC with Differential/Platelet   HgB A1c   Lipid panel   Comprehensive metabolic panel   Vitamin D (25 hydroxy)   Other Visit Diagnoses     Breast cancer screening by mammogram       Relevant Orders   MM 3D SCREEN BREAST BILATERAL       Return in about 1 year (around 08/01/2023) for CPE.     I, Alfredia Ferguson, PA-C have reviewed all documentation for this visit. The documentation on  07/31/2022  for the exam, diagnosis, procedures, and orders are all accurate and complete.  Alfredia Ferguson, PA-C [facility] [address] [place], [place], [address] Office: [phone] Fax: [phone]   [facility]

## 2022-07-31 NOTE — Assessment & Plan Note (Signed)
Pt reports being up to date on flu vaccine. Ordered mammogram due 12/23

## 2022-08-01 LAB — VITAMIN D 25 HYDROXY (VIT D DEFICIENCY, FRACTURES): Vit D, 25-Hydroxy: 56.4 ng/mL (ref 30.0–100.0)

## 2022-08-01 LAB — COMPREHENSIVE METABOLIC PANEL
ALT: 23 IU/L (ref 0–32)
AST: 21 IU/L (ref 0–40)
Albumin/Globulin Ratio: 2 (ref 1.2–2.2)
Albumin: 4.6 g/dL (ref 3.9–4.9)
Alkaline Phosphatase: 79 IU/L (ref 44–121)
BUN/Creatinine Ratio: 23 (ref 12–28)
BUN: 20 mg/dL (ref 8–27)
Bilirubin Total: 0.6 mg/dL (ref 0.0–1.2)
CO2: 22 mmol/L (ref 20–29)
Calcium: 10.2 mg/dL (ref 8.7–10.3)
Chloride: 99 mmol/L (ref 96–106)
Creatinine, Ser: 0.86 mg/dL (ref 0.57–1.00)
Globulin, Total: 2.3 g/dL (ref 1.5–4.5)
Glucose: 73 mg/dL (ref 70–99)
Potassium: 4.5 mmol/L (ref 3.5–5.2)
Sodium: 136 mmol/L (ref 134–144)
Total Protein: 6.9 g/dL (ref 6.0–8.5)
eGFR: 77 mL/min/{1.73_m2} (ref >59)

## 2022-08-01 LAB — LIPID PANEL
Chol/HDL Ratio: 2.2 ratio (ref 0.0–4.4)
Cholesterol, Total: 175 mg/dL (ref 100–199)
HDL: 79 mg/dL (ref >39)
LDL Chol Calc (NIH): 86 mg/dL (ref 0–99)
Triglycerides: 47 mg/dL (ref 0–149)
VLDL Cholesterol Cal: 10 mg/dL (ref 5–40)

## 2022-08-01 LAB — CBC WITH DIFFERENTIAL/PLATELET
Basophils Absolute: 0 10*3/uL (ref 0.0–0.2)
Basos: 1 %
EOS (ABSOLUTE): 0 10*3/uL (ref 0.0–0.4)
Eos: 1 %
Hematocrit: 42.9 % (ref 34.0–46.6)
Hemoglobin: 14.1 g/dL (ref 11.1–15.9)
Immature Grans (Abs): 0 10*3/uL (ref 0.0–0.1)
Immature Granulocytes: 0 %
Lymphocytes Absolute: 1.7 10*3/uL (ref 0.7–3.1)
Lymphs: 27 %
MCH: 28.1 pg (ref 26.6–33.0)
MCHC: 32.9 g/dL (ref 31.5–35.7)
MCV: 86 fL (ref 79–97)
Monocytes Absolute: 0.5 10*3/uL (ref 0.1–0.9)
Monocytes: 8 %
Neutrophils Absolute: 3.9 10*3/uL (ref 1.4–7.0)
Neutrophils: 63 %
Platelets: 313 10*3/uL (ref 150–450)
RBC: 5.02 x10E6/uL (ref 3.77–5.28)
RDW: 12.6 % (ref 11.7–15.4)
WBC: 6.2 10*3/uL (ref 3.4–10.8)

## 2022-08-01 LAB — HEMOGLOBIN A1C
Est. average glucose Bld gHb Est-mCnc: 114 mg/dL
Hgb A1c MFr Bld: 5.6 % (ref 4.8–5.6)

## 2022-08-07 DIAGNOSIS — B360 Pityriasis versicolor: Secondary | ICD-10-CM | POA: Diagnosis not present

## 2022-08-07 DIAGNOSIS — L648 Other androgenic alopecia: Secondary | ICD-10-CM | POA: Diagnosis not present

## 2022-09-05 ENCOUNTER — Ambulatory Visit
Admission: RE | Admit: 2022-09-05 | Discharge: 2022-09-05 | Disposition: A | Discharge status: Home / Self Care | Payer: BC Managed Care – PPO | Source: Ambulatory Visit | Attending: Physician Assistant | Admitting: Physician Assistant

## 2022-09-05 DIAGNOSIS — Z1231 Encounter for screening mammogram for malignant neoplasm of breast: Secondary | ICD-10-CM | POA: Insufficient documentation

## 2022-09-06 NOTE — Progress Notes (Signed)
[place]  Normal mammogram; repeat in 1 year.  Please let us know if you have any questions.  Thank you,  Merita Norton, FNP

## 2022-09-07 ENCOUNTER — Encounter: Payer: Self-pay | Admitting: *Deleted

## 2023-06-13 DIAGNOSIS — L648 Other androgenic alopecia: Secondary | ICD-10-CM | POA: Diagnosis not present

## 2023-06-13 DIAGNOSIS — Z79899 Other long term (current) drug therapy: Secondary | ICD-10-CM | POA: Diagnosis not present

## 2023-06-20 ENCOUNTER — Ambulatory Visit: Payer: BC Managed Care – PPO | Admitting: Family Medicine

## 2023-06-20 ENCOUNTER — Ambulatory Visit (INDEPENDENT_AMBULATORY_CARE_PROVIDER_SITE_OTHER): Payer: BC Managed Care – PPO | Admitting: Family Medicine

## 2023-06-20 ENCOUNTER — Encounter: Payer: Self-pay | Admitting: Family Medicine

## 2023-06-20 VITALS — BP 131/88 | HR 78 | Temp 97.9°F | Ht 65.0 in | Wt 158.2 lb

## 2023-06-20 DIAGNOSIS — R1084 Generalized abdominal pain: Secondary | ICD-10-CM

## 2023-06-20 DIAGNOSIS — R6889 Other general symptoms and signs: Secondary | ICD-10-CM | POA: Diagnosis not present

## 2023-06-20 DIAGNOSIS — E559 Vitamin D deficiency, unspecified: Secondary | ICD-10-CM

## 2023-06-20 MED ORDER — HYDROXYZINE HCL 10 MG PO TABS: 10.0000 mg | ORAL_TABLET | Freq: Every day | ORAL | 0 refills | Status: AC

## 2023-06-20 NOTE — Assessment & Plan Note (Signed)
Unintentional weight loss of 8 pounds since November 2023. Likely related to stress and grief. -Monitor weight. -Encourage adequate nutrition. -check CMP,CBC,TSH, A1c

## 2023-06-20 NOTE — Assessment & Plan Note (Signed)
Currently on 50,000 units weekly. chronic -Order Vitamin D level to assess adequacy of current supplementation.

## 2023-06-20 NOTE — Progress Notes (Signed)
Established patient visit   Patient: Emily Roth   DOB: 1961/04/27   62 y.o. Female  MRN: [id number] Visit Date: 06/20/2023  Today's healthcare provider: Ronnald Ramp, MD   Chief Complaint  Patient presents with   Weight Loss    Lab work needed for vitamin D, been under a lot of stress and worrying the last couple of months with stomach issues   Subjective     HPI     Weight Loss    Additional comments: Lab work needed for vitamin D, been under a lot of stress and worrying the last couple of months with stomach issues      Last edited by Clois Comber on 06/20/2023  2:23 PM.       Discussed the use of AI scribe software for clinical note transcription with the patient, who gave verbal consent to proceed.  History of Present Illness   The patient, known to have a history of vitamin D deficiency, presented with concerns about recent weight changes, stress, and gastrointestinal discomfort. 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 This event occurred in early September and has been a source of ongoing stress and grief, compounded by legal issues related to the friend's will.  The patient reported a decrease in appetite and a constant abdominal pain that was present for a week but has since improved. The pain was described as being all over the abdomen, but more recently localized to the lower region. No specific triggers were identified. The patient also reported a weight loss of 8 pounds since November of the previous year, which was unintentional and likely related to decreased caloric intake due to stress and grief.  The patient has been seeking counseling to manage her emotional distress and has returned to work, which provides some distraction. However, she reported difficulty sleeping due to intrusive thoughts at night. The patient denied any changes in bowel habits, urinary symptoms, or other  systemic symptoms. She continues to take a weekly vitamin D supplement of 50,000 units.      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   Clobetasol Propionate Emulsion 0.05 % topical foam APPLY TO THE AFFECTED AREA EVERY NIGHT AT BEDTIME   meloxicam (MOBIC) 7.5 MG tablet Take 1 tablet (7.5 mg total) by mouth daily.   Multiple Vitamin (MULTIVITAMIN) tablet Take 1 tablet by mouth daily.   Multiple Vitamins-Minerals (HAIR/SKIN/NAILS PO) Take by mouth.   Omega-3 Fatty Acids (FISH OIL PO) Take by mouth.   Vitamin D, Ergocalciferol, (DRISDOL) 1.25 MG (50000 UT) CAPS capsule TAKE 1 CAPSULE BY MOUTH ONCE EVERY MONTH   No facility-administered medications prior to visit.      06/20/2023    2:30 PM 07/31/2022   11:12 AM 07/28/2021    9:55 AM 07/26/2020    4:07 PM 07/21/2019   10:51 AM  Depression screen PHQ 2/9  Decreased Interest 0 0 0 0 0  Down, Depressed, Hopeless 0 0 0 0 0  PHQ - 2 Score 0 0 0 0 0  Altered sleeping  0 0  0  Tired, decreased energy  0 0  0  Change in appetite  0  0  0  Feeling bad or failure about yourself   0 0  0  Trouble concentrating  0 0  0  Moving slowly or fidgety/restless  0 0  0  Suicidal thoughts  0 0  0  PHQ-9 Score  0 0  0  Difficult doing work/chores  Not difficult at all Not difficult at all  Not difficult at all    Review of Systems  Last CBC Lab Results  Component Value Date   WBC 6.2 07/31/2022   HGB 14.1 07/31/2022   HCT 42.9 07/31/2022   MCV 86 07/31/2022   MCH 28.1 07/31/2022   RDW 12.6 07/31/2022   PLT 313 07/31/2022   Last metabolic panel Lab Results  Component Value Date   GLUCOSE 73 07/31/2022   NA 136 07/31/2022   K 4.5 07/31/2022   CL 99 07/31/2022   CO2 22 07/31/2022   BUN 20 07/31/2022   CREATININE 0.86 07/31/2022   EGFR 77  07/31/2022   CALCIUM 10.2 07/31/2022   PROT 6.9 07/31/2022   ALBUMIN 4.6 07/31/2022   LABGLOB 2.3 07/31/2022   AGRATIO 2.0 07/31/2022   BILITOT 0.6 07/31/2022   ALKPHOS 79 07/31/2022   AST 21 07/31/2022   ALT 23 07/31/2022   Last hemoglobin A1c Lab Results  Component Value Date   HGBA1C 5.6 07/31/2022   Last thyroid functions Lab Results  Component Value Date   TSH 0.838 07/29/2021   Last vitamin D Lab Results  Component Value Date   VD25OH 56.4 07/31/2022        Objective    BP 131/88 (BP Location: Right Arm, Patient Position: Sitting, Cuff Size: Normal)   Pulse 78   Temp 97.9 F (36.6 C)   Ht 5\' 5"  (1.651 m)   Wt 158 lb 3.2 oz (71.8 kg)   LMP 01/10/2004 (Approximate)   SpO2 100%   BMI 26.33 kg/m   BP Readings from Last 3 Encounters:  06/20/23 131/88  07/31/22 116/81  07/28/21 135/89   Wt Readings from Last 3 Encounters:  06/20/23 158 lb 3.2 oz (71.8 kg)  07/31/22 166 lb 6.4 oz (75.5 kg)  07/28/21 164 lb (74.4 kg)       Physical Exam Vitals reviewed.  Constitutional:      General: She is not in acute distress.    Appearance: Normal appearance. She is not ill-appearing, toxic-appearing or diaphoretic.  Eyes:     Conjunctiva/sclera: Conjunctivae normal.  Cardiovascular:     Rate and Rhythm: Normal rate and regular rhythm.     Pulses: Normal pulses.     Heart sounds: Normal heart sounds. No murmur heard.    No friction rub. No gallop.  Pulmonary:     Effort: Pulmonary effort is normal. No respiratory distress.     Breath sounds: Normal breath sounds. No stridor. No wheezing, rhonchi or rales.  Abdominal:     General: Bowel sounds are normal. There is no distension.     Palpations: Abdomen is soft. There is no shifting dullness, fluid wave, hepatomegaly, mass or pulsatile mass.     Tenderness: There is no abdominal tenderness. There is no right CVA tenderness, left CVA tenderness, guarding or rebound.  Musculoskeletal:     Right lower leg: No  edema.     Left lower leg: No edema.  Skin:    Findings: No erythema or rash.  Neurological:     Mental Status: She is alert and oriented to person, place, and time.  No results found for any visits on 06/20/23.  Assessment & Plan     Problem List Items Addressed This Visit     Avitaminosis D    Currently on 50,000 units weekly. chronic -Order Vitamin D level to assess adequacy of current supplementation.      Relevant Orders   Vitamin D (25 hydroxy)   Unintentional weight change - Primary    Unintentional weight loss of 8 pounds since November 2023. Likely related to stress and grief. -Monitor weight. -Encourage adequate nutrition. -check CMP,CBC,TSH, A1c       Relevant Orders   CMP14+EGFR   Hemoglobin A1c   TSH+T4F+T3Free   CBC   Other Visit Diagnoses     Generalized abdominal pain               Stress and Grief Recent loss of a close friend and ongoing stress related to the friend's estate. Currently seeing a counselor. -Continue counseling. -Prescribe Hydroxyzine 10mg  at bedtime as needed for anxiety and sleep.  Abdominal Pain Reports of intermittent abdominal pain, particularly last week. No specific triggers identified. No associated GI symptoms. -Order Complete Metabolic Panel, CBC, Thyroid studies, and Hemoglobin A1c to rule out any underlying metabolic or systemic causes. -Advise patient to report any worsening symptoms or development of nausea.          No follow-ups on file.         Ronnald Ramp, MD  [facility] [phone] (phone) [phone] (fax)  [facility]

## 2023-06-20 NOTE — Patient Instructions (Signed)
VISIT SUMMARY:  During your visit, we discussed your recent weight changes, stress, and gastrointestinal discomfort. You shared that you've been dealing with the loss of a close friend, which has been a source of ongoing stress and grief. You've also experienced a decrease in appetite and abdominal pain, which has improved. You've lost 8 pounds since last November, likely due to decreased eating from stress and grief. You continue to take a weekly vitamin D supplement.  YOUR PLAN:  -STRESS AND GRIEF: You've been dealing with the loss of a close friend and related stress. It's good that you're seeing a Veterinary surgeon. I've prescribed Hydroxyzine, a medication that can help with anxiety and sleep, to be taken at bedtime as needed.  -ABDOMINAL PAIN: You've had intermittent abdominal pain, especially last week. I've ordered some blood tests to check for any underlying causes. Please let me know if your symptoms worsen or if you develop nausea.  -VITAMIN D SUPPLEMENTATION: You're currently taking a weekly vitamin D supplement. I've ordered a test to check your vitamin D level and see if your current supplement is enough.  -WEIGHT LOSS: You've lost 8 pounds since last November, likely due to decreased eating from stress and grief. It's important to monitor your weight and make sure you're getting enough nutrition.  INSTRUCTIONS:  Your next annual visit is scheduled for August 06, 2023. Please continue with your counseling and take the prescribed Hydroxyzine as needed. Remember to report any worsening abdominal symptoms or development of nausea. Also, please ensure you're eating adequately to maintain your weight.

## 2023-06-21 LAB — CMP14+EGFR
ALT: 18 [IU]/L (ref 0–32)
AST: 21 [IU]/L (ref 0–40)
Albumin: 3.9 g/dL (ref 3.9–4.9)
Alkaline Phosphatase: 76 [IU]/L (ref 44–121)
BUN/Creatinine Ratio: 17 (ref 12–28)
BUN: 17 mg/dL (ref 8–27)
Bilirubin Total: 0.4 mg/dL (ref 0.0–1.2)
CO2: 23 mmol/L (ref 20–29)
Calcium: 9.6 mg/dL (ref 8.7–10.3)
Chloride: 104 mmol/L (ref 96–106)
Creatinine, Ser: 0.99 mg/dL (ref 0.57–1.00)
Globulin, Total: 2.8 g/dL (ref 1.5–4.5)
Glucose: 75 mg/dL (ref 70–99)
Potassium: 4.6 mmol/L (ref 3.5–5.2)
Sodium: 140 mmol/L (ref 134–144)
Total Protein: 6.7 g/dL (ref 6.0–8.5)
eGFR: 65 mL/min/{1.73_m2} (ref >59)

## 2023-06-21 LAB — TSH+T4F+T3FREE
Free T4: 0.94 ng/dL (ref 0.82–1.77)
T3, Free: 3.1 pg/mL (ref 2.0–4.4)
TSH: 1.2 u[IU]/mL (ref 0.450–4.500)

## 2023-06-21 LAB — CBC
Hematocrit: 40.5 % (ref 34.0–46.6)
Hemoglobin: 12.9 g/dL (ref 11.1–15.9)
MCH: 28.6 pg (ref 26.6–33.0)
MCHC: 31.9 g/dL (ref 31.5–35.7)
MCV: 90 fL (ref 79–97)
Platelets: 246 10*3/uL (ref 150–450)
RBC: 4.51 x10E6/uL (ref 3.77–5.28)
RDW: 13 % (ref 11.7–15.4)
WBC: 6.4 10*3/uL (ref 3.4–10.8)

## 2023-06-21 LAB — HEMOGLOBIN A1C
Est. average glucose Bld gHb Est-mCnc: 108 mg/dL
Hgb A1c MFr Bld: 5.4 % (ref 4.8–5.6)

## 2023-06-21 LAB — VITAMIN D 25 HYDROXY (VIT D DEFICIENCY, FRACTURES): Vit D, 25-Hydroxy: 73.3 ng/mL (ref 30.0–100.0)

## 2023-07-30 NOTE — Progress Notes (Unsigned)
Complete physical exam   Patient: Emily Roth   DOB: 1961-08-30   62 y.o. Female  MRN: [id number] Visit Date: 08/06/2023  Today's healthcare provider: Ronnald Ramp, MD   No chief complaint on file.  Subjective    Emily Roth is a 62 y.o. female who presents today for a complete physical exam.   She reports consuming a {diet types:17450} diet.   {Exercise:19826}   She generally feels {well/fairly well/poorly:18703}.   She reports sleeping {well/fairly well/poorly:18703}.    She {does/does not:200015} have additional problems to discuss today.   Discussed the use of AI scribe software for clinical note transcription with the patient, who gave verbal consent to proceed.  History of Present Illness             Past Medical History:  Diagnosis Date   Premature ovarian failure 2005   age 12   Past Surgical History:  Procedure Laterality Date   COLONOSCOPY WITH PROPOFOL N/A 11/08/2017   Procedure: COLONOSCOPY WITH PROPOFOL;  Surgeon: Pasty Spillers, MD;  Location: [facility] ENDOSCOPY;  Service: Endoscopy;  Laterality: N/A;   WISDOM TOOTH EXTRACTION  age 49   Social History   Socioeconomic History   Marital status: Single    Spouse name: Not on file   Number of children: 1   Years of education: Not on file   Highest education level: Not on file  Occupational History   Not on file  Tobacco Use   Smoking status: Never   Smokeless tobacco: Never  Vaping Use   Vaping status: Never Used  Substance and Sexual Activity   Alcohol use: No    Comment: rare   Drug use: No   Sexual activity: Not Currently    Partners: Male    Birth control/protection: Post-menopausal  Other Topics Concern   Not on file  Social History Narrative   She works in Engineering geologist.  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   Social Determinants of Health   Financial Resource Strain: Not on file  Food Insecurity: Not on  file  Transportation Needs: Not on file  Physical Activity: Not on file  Stress: Not on file  Social Connections: Not on file  Intimate Partner Violence: Not on file   Family Status  Relation Name Status   Sister  Deceased at age late 6       [place] Syndrome   Brother  Deceased at age 41       [place] Syndrome   Brother  Deceased at age late 4       [place] Syndrome   Mother  Alive   Father  Deceased       complications of pneumonia and CVA   Mat Aunt  Deceased   Mat Uncle  Deceased at age 67       cancer   Other Nephew Tyson Deceased at age 39       [place] Syndrome   Other Harlin Heys Deceased at age 38       [place] Syndrome   Other Nephew Feliz Beam Deceased at age about 67       [place] Syndrome   Mat Uncle  Deceased   Neg Hx  (Not Specified)  No partnership data on file   Family History  Problem Relation Age of Onset   Other Sister        [place] Syndrome  Other Brother        [employer] [place] Syndrome   Other Brother        [place] Syndrome   Other Other        [place] Syndrome   Other Other        [place] Syndrome   Other Other        nephew - [place]   Cancer Maternal Uncle        ?   Breast cancer Neg Hx    No Known Allergies   Medications: Outpatient Medications Prior to Visit  Medication Sig   aspirin 81 MG tablet    CALCIUM PO Take by mouth.   Clobetasol Propionate Emulsion 0.05 % topical foam APPLY TO THE AFFECTED AREA EVERY NIGHT AT BEDTIME   hydrOXYzine (ATARAX) 10 MG tablet Take 1 tablet (10 mg total) by mouth at bedtime.   meloxicam (MOBIC) 7.5 MG tablet Take 1 tablet (7.5 mg total) by mouth daily.   Multiple Vitamin (MULTIVITAMIN) tablet Take 1 tablet by mouth daily.   Multiple Vitamins-Minerals (HAIR/SKIN/NAILS PO) Take by mouth.   Omega-3 Fatty Acids (FISH OIL PO) Take by mouth.   Vitamin D, Ergocalciferol, (DRISDOL) 1.25 MG (50000 UT) CAPS capsule TAKE 1 CAPSULE BY MOUTH ONCE EVERY MONTH   No facility-administered  medications prior to visit.    Review of Systems  Last CBC Lab Results  Component Value Date   WBC 6.4 06/20/2023   HGB 12.9 06/20/2023   HCT 40.5 06/20/2023   MCV 90 06/20/2023   MCH 28.6 06/20/2023   RDW 13.0 06/20/2023   PLT 246 06/20/2023   Last metabolic panel Lab Results  Component Value Date   GLUCOSE 75 06/20/2023   NA 140 06/20/2023   K 4.6 06/20/2023   CL 104 06/20/2023   CO2 23 06/20/2023   BUN 17 06/20/2023   CREATININE 0.99 06/20/2023   EGFR 65 06/20/2023   CALCIUM 9.6 06/20/2023   PROT 6.7 06/20/2023   ALBUMIN 3.9 06/20/2023   LABGLOB 2.8 06/20/2023   AGRATIO 2.0 07/31/2022   BILITOT 0.4 06/20/2023   ALKPHOS 76 06/20/2023   AST 21 06/20/2023   ALT 18 06/20/2023   Last lipids Lab Results  Component Value Date   CHOL 175 07/31/2022   HDL 79 07/31/2022   LDLCALC 86 07/31/2022   TRIG 47 07/31/2022   CHOLHDL 2.2 07/31/2022   Last hemoglobin A1c Lab Results  Component Value Date   HGBA1C 5.4 06/20/2023   Last thyroid functions Lab Results  Component Value Date   TSH 1.200 06/20/2023   Last vitamin D Lab Results  Component Value Date   VD25OH 73.3 06/20/2023   Last vitamin B12 and Folate No results found for: "VITAMINB12", "FOLATE"   {See past labs  Heme  Chem  Endocrine  Serology  Results Review (optional):1}  Objective    LMP 01/10/2004 (Approximate)  {Insert last BP/Wt (optional):23777}{See vitals history (optional):1}    Physical Exam  ***  Last depression screening scores    06/20/2023    2:30 PM 07/31/2022   11:12 AM 07/28/2021    9:55 AM  PHQ 2/9 Scores  PHQ - 2 Score 0 0 0  PHQ- 9 Score  0 0    Last fall risk screening    07/31/2022   11:12 AM  Fall Risk   Falls in the past year? 0  Number falls in past yr: 0  Injury with Fall? 0  Risk for fall due  to : No Fall Risks  Follow up Falls evaluation completed    Last Audit-C alcohol use screening    07/31/2022   11:12 AM  Alcohol Use Disorder Test  (AUDIT)  1. How often do you have a drink containing alcohol? 1  2. How many drinks containing alcohol do you have on a typical day when you are drinking? 0  3. How often do you have six or more drinks on one occasion? 0  AUDIT-C Score 1   A score of 3 or more in women, and 4 or more in men indicates increased risk for alcohol abuse, EXCEPT if all of the points are from question 1   No results found for any visits on 08/06/23.  Assessment & Plan    Routine Health Maintenance and Physical Exam  Immunization History  Administered Date(s) Administered   Hepatitis B 09/11/2013, 10/12/2013, 03/11/2014   Hepatitis B, ADULT 10/24/2013, 02/20/2014   Influenza,inj,Quad PF,6+ Mos 06/15/2022   Influenza-Unspecified 07/04/2021   Tdap 05/09/2013   Typhoid Inactivated 03/11/2014    Health Maintenance  Topic Date Due   Zoster Vaccines- Shingrix (1 of 2) Never done   INFLUENZA VACCINE  04/12/2023   DTaP/Tdap/Td (2 - Td or Tdap) 05/10/2023   COVID-19 Vaccine (1 - 2023-24 season) Never done   Cervical Cancer Screening (HPV/Pap Cotest)  07/16/2023   MAMMOGRAM  09/06/2023   Colonoscopy  11/09/2027   Hepatitis C Screening  Completed   HIV Screening  Completed   HPV VACCINES  Aged Out    Problem List Items Addressed This Visit   None   Assessment and Plan                No follow-ups on file.       Ronnald Ramp, MD  [facility] [phone] (phone) [phone] (fax)  [facility]

## 2023-08-05 DIAGNOSIS — Z0000 Encounter for general adult medical examination without abnormal findings: Secondary | ICD-10-CM | POA: Insufficient documentation

## 2023-08-05 NOTE — Patient Instructions (Signed)
It was a pleasure to see you today!  Thank you for choosing [facility] for your primary care.   Today you were seen for your annual physical  Please review the attached information regarding helpful preventive health topics.   To keep you healthy, please keep in mind the following health maintenance items that you are due for:   1.Updated Tetanus booster  2. Shingrix vaccine  3. COVID vaccine  4. Influenza Vaccine   Please make sure to schedule your next annual physical for one year from today.   Best Wishes,   Dr. Roxan Hockey

## 2023-08-06 ENCOUNTER — Other Ambulatory Visit (HOSPITAL_COMMUNITY)
Admission: RE | Admit: 2023-08-06 | Discharge: 2023-08-06 | Disposition: A | Discharge status: Home / Self Care | Payer: BC Managed Care – PPO | Source: Ambulatory Visit | Attending: Family Medicine | Admitting: Family Medicine

## 2023-08-06 ENCOUNTER — Encounter: Payer: Self-pay | Admitting: Family Medicine

## 2023-08-06 ENCOUNTER — Ambulatory Visit (INDEPENDENT_AMBULATORY_CARE_PROVIDER_SITE_OTHER): Payer: BC Managed Care – PPO | Admitting: Family Medicine

## 2023-08-06 VITALS — BP 120/86 | HR 86 | Resp 16 | Ht 65.0 in | Wt 158.2 lb

## 2023-08-06 DIAGNOSIS — Z13220 Encounter for screening for lipoid disorders: Secondary | ICD-10-CM | POA: Diagnosis not present

## 2023-08-06 DIAGNOSIS — Z124 Encounter for screening for malignant neoplasm of cervix: Secondary | ICD-10-CM

## 2023-08-06 DIAGNOSIS — Z1231 Encounter for screening mammogram for malignant neoplasm of breast: Secondary | ICD-10-CM | POA: Diagnosis not present

## 2023-08-06 DIAGNOSIS — Z0000 Encounter for general adult medical examination without abnormal findings: Secondary | ICD-10-CM | POA: Diagnosis not present

## 2023-08-06 NOTE — Assessment & Plan Note (Addendum)
Chronic conditions are stable  Patient was counseled on benefits of regular physical activity with goal of 150 minutes of moderate to vigurous intensity 4 days per week  Patient was counseled to consume well balanced diet of fruits, vegetables, limited saturated fats and limited sugary foods and beverages with emphasis on consuming 6-8 glasses of water daily  Screening recommended today: lipids (other labs collected within last 60 days)   Colon cancer screening: UTD   Cervical CA screening: collected today    Mammogram: ordered today   Vaccines recommended today: influenza vaccine reported in Oct, needs updated records from hospital

## 2023-08-07 LAB — LIPID PANEL
Chol/HDL Ratio: 2.3 {ratio} (ref 0.0–4.4)
Cholesterol, Total: 181 mg/dL (ref 100–199)
HDL: 80 mg/dL (ref >39)
LDL Chol Calc (NIH): 92 mg/dL (ref 0–99)
Triglycerides: 43 mg/dL (ref 0–149)
VLDL Cholesterol Cal: 9 mg/dL (ref 5–40)

## 2023-08-08 LAB — CYTOLOGY - PAP
Comment: NEGATIVE
Diagnosis: NEGATIVE
High risk HPV: NEGATIVE

## 2023-09-07 ENCOUNTER — Ambulatory Visit
Admission: RE | Admit: 2023-09-07 | Discharge: 2023-09-07 | Disposition: A | Discharge status: Home / Self Care | Payer: BC Managed Care – PPO | Source: Ambulatory Visit | Attending: Family Medicine

## 2023-09-07 DIAGNOSIS — Z1231 Encounter for screening mammogram for malignant neoplasm of breast: Secondary | ICD-10-CM | POA: Insufficient documentation

## 2023-12-22 IMAGING — MR MR KNEE*L* W/O CM
6 series · 40 of 40 positions shown · non-contrast
Comparison: None.

CLINICAL DATA: Left lateral knee pain for 4 months.

EXAM:
MRI OF THE LEFT KNEE WITHOUT CONTRAST
TECHNIQUE: Multiplanar, multisequence MR imaging of the knee was performed. No
intravenous contrast was administered.

[Series 8: T2 fat-sat · axial · left · 4.0mm · 0.50mm/px · z∈[-95,+29]mm · 6 of 26 slices shown (1 of 3)]
[im 1/26]
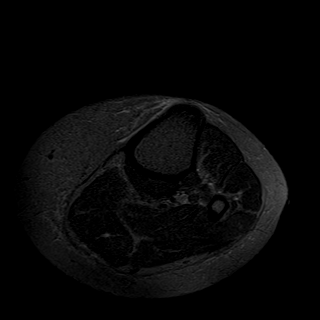
[im 6/26]
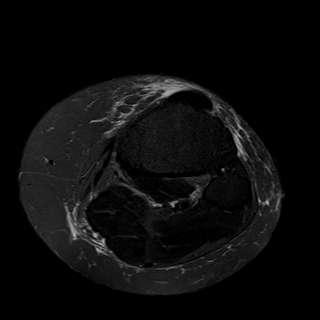
[im 11/26]
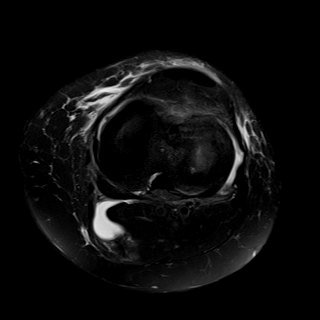
[im 16/26]
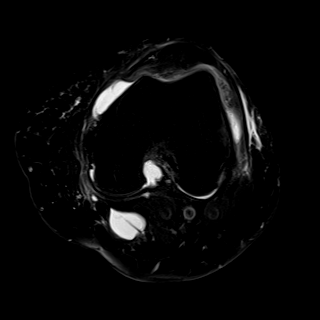
[im 21/26]
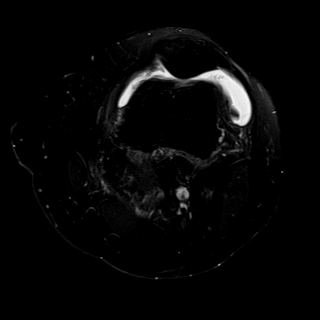
[im 26/26]
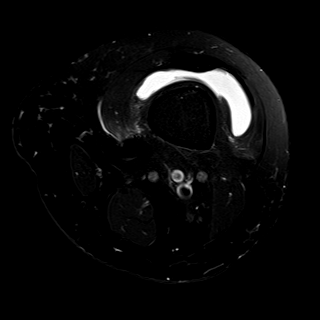

[Series 9: T2 fat-sat · coronal · left · 4.0mm · 0.59mm/px · 6 of 26 slices shown (2 of 3)]
[im 1/26]
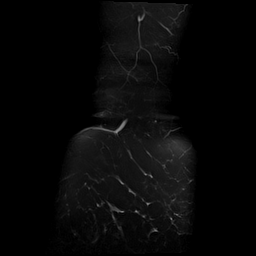
[im 6/26]
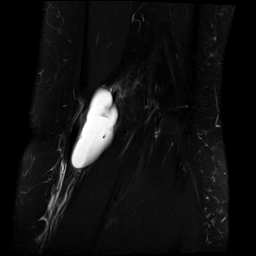
[im 11/26]
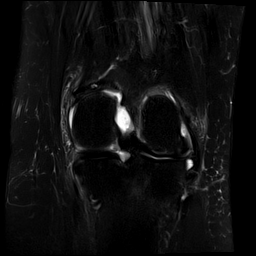
[im 16/26]
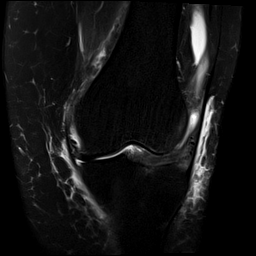
[im 21/26]
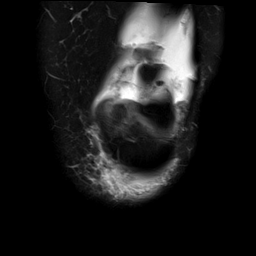
[im 26/26]
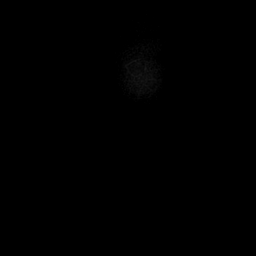

[Series 10: T1 · coronal · left · 4.0mm · 0.59mm/px · 6 of 26 slices shown]
[im 1/26]
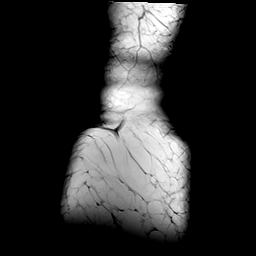
[im 6/26]
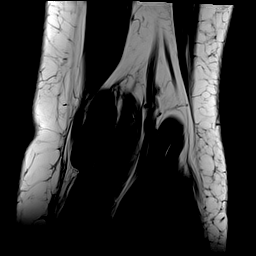
[im 11/26]
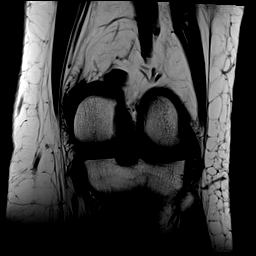
[im 16/26]
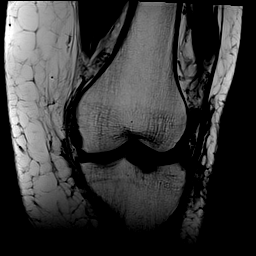
[im 21/26]
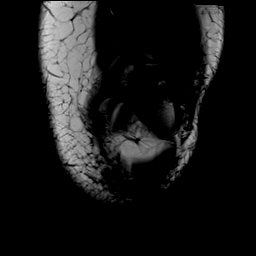
[im 26/26]
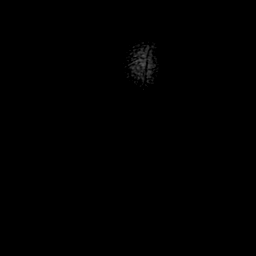

[Series 11: PD fat-sat · coronal · left · 4.0mm · 0.59mm/px · 6 of 26 slices shown (1 of 2)]
[im 1/26]
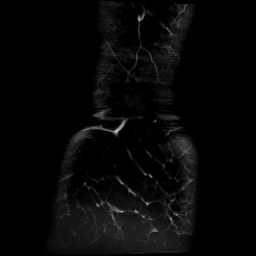
[im 6/26]
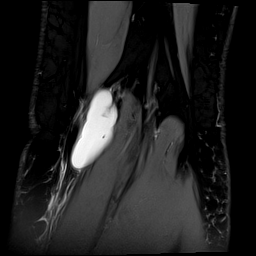
[im 11/26]
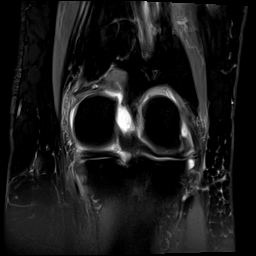
[im 16/26]
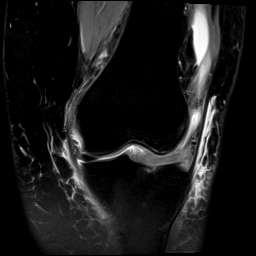
[im 21/26]
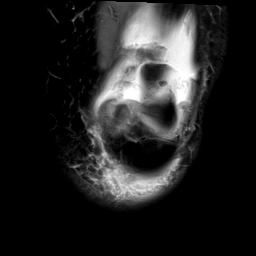
[im 26/26]
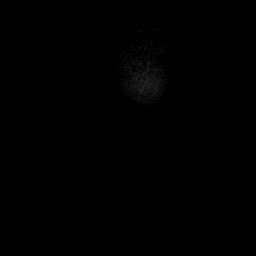

[Series 12: PD fat-sat · sagittal · left · 3.0mm · 0.59mm/px · 8 of 33 slices shown (2 of 2)]
[im 1/33]
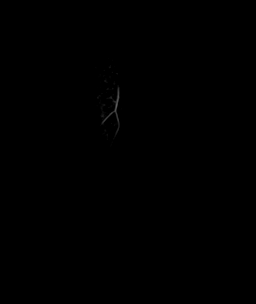
[im 5/33]
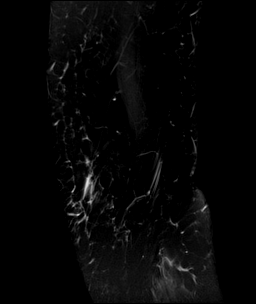
[im 10/33]
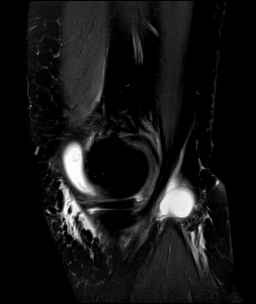
[im 14/33]
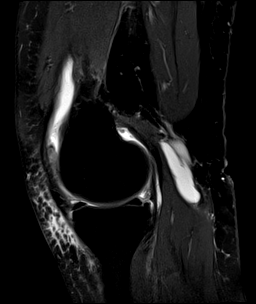
[im 19/33]
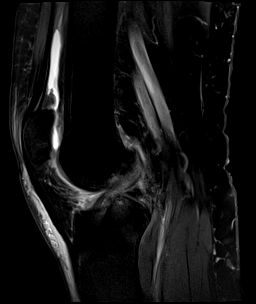
[im 23/33]
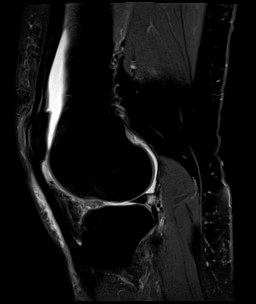
[im 28/33]
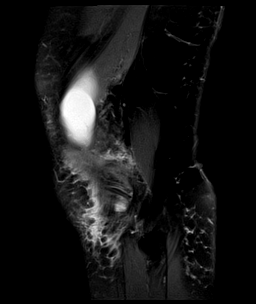
[im 33/33]
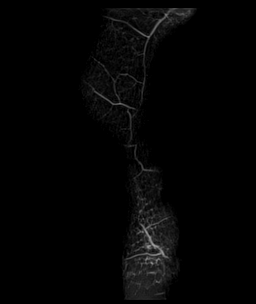

[Series 13: T2 fat-sat · sagittal · left · 3.0mm · 0.59mm/px · 8 of 35 slices shown (3 of 3)]
[im 1/35]
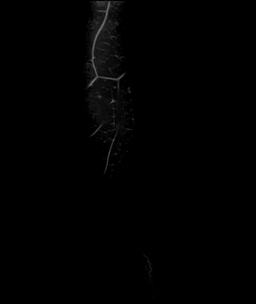
[im 5/35]
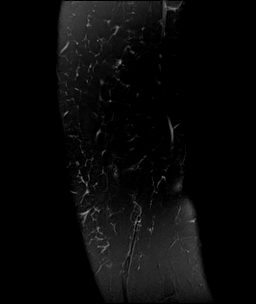
[im 10/35]
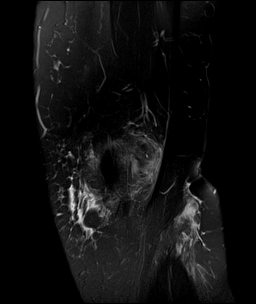
[im 15/35]
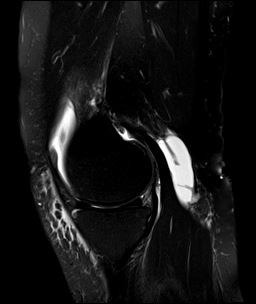
[im 20/35]
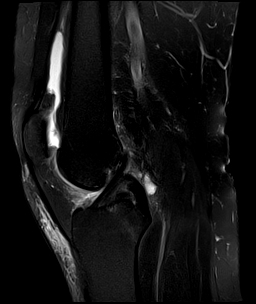
[im 25/35]
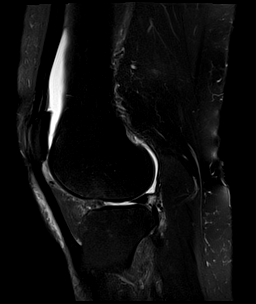
[im 30/35]
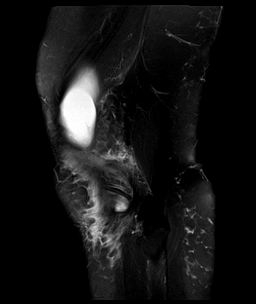
[im 35/35]
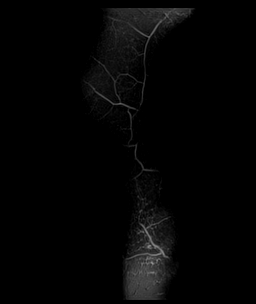

[40 of 40 positions shown; findings below may reference images not displayed]

FINDINGS: MENISCI

Medial: Intact.

Lateral: Maceration of the anterior horn of the lateral meniscus.
Tiny radial tear of the free edge of the body of the lateral
meniscus.

LIGAMENTS

Cruciates: ACL and PCL are intact.

Collaterals: Medial collateral ligament is intact. Lateral
collateral ligament complex is intact.

CARTILAGE

Patellofemoral: Partial-thickness cartilage loss of the
patellofemoral compartment.

Medial: High-grade partial-thickness cartilage loss of the medial
femorotibial compartment.

Lateral: High-grade partial-thickness cartilage loss with areas of
full-thickness cartilage loss of the lateral femorotibial
compartment.

JOINT: Large joint effusion. Edema in superolateral Hoffa's fat. No
plical thickening.

POPLITEAL FOSSA: Popliteus tendon is intact. Small Baker's cyst.

EXTENSOR MECHANISM: Intact quadriceps tendon. Intact patellar
tendon. Intact lateral patellar retinaculum. Intact medial patellar
retinaculum. Intact MPFL.

BONES: No aggressive osseous lesion. No fracture or dislocation.
Mild reactive marrow edema at the ACL insertion.

Other: No fluid collection or hematoma. Muscles are normal.
IMPRESSION: 1. Maceration of the anterior horn of the lateral meniscus. Tiny
radial tear of the free edge of the body of the lateral meniscus.
2. Tricompartmental cartilage abnormalities as described above.
3. Large joint effusion.
4. Edema in superolateral Hoffa's fat as can be seen with patellar
tendon-lateral femoral condyle friction syndrome.

## 2024-08-06 ENCOUNTER — Encounter: Payer: Self-pay | Admitting: Family Medicine

## 2024-08-06 ENCOUNTER — Ambulatory Visit (INDEPENDENT_AMBULATORY_CARE_PROVIDER_SITE_OTHER): Admitting: Family Medicine

## 2024-08-06 VITALS — BP 112/69 | Resp 16 | Ht 63.0 in | Wt 163.0 lb

## 2024-08-06 DIAGNOSIS — Z0000 Encounter for general adult medical examination without abnormal findings: Secondary | ICD-10-CM

## 2024-08-06 DIAGNOSIS — Z131 Encounter for screening for diabetes mellitus: Secondary | ICD-10-CM

## 2024-08-06 DIAGNOSIS — Z6828 Body mass index (BMI) 28.0-28.9, adult: Secondary | ICD-10-CM

## 2024-08-06 DIAGNOSIS — E559 Vitamin D deficiency, unspecified: Secondary | ICD-10-CM

## 2024-08-06 DIAGNOSIS — E010 Iodine-deficiency related diffuse (endemic) goiter: Secondary | ICD-10-CM | POA: Diagnosis not present

## 2024-08-06 DIAGNOSIS — Z1231 Encounter for screening mammogram for malignant neoplasm of breast: Secondary | ICD-10-CM

## 2024-08-06 DIAGNOSIS — E663 Overweight: Secondary | ICD-10-CM

## 2024-08-06 DIAGNOSIS — Z130 Encounter for screening for diseases of the blood and blood-forming organs and certain disorders involving the immune mechanism: Secondary | ICD-10-CM

## 2024-08-06 DIAGNOSIS — Z13220 Encounter for screening for lipoid disorders: Secondary | ICD-10-CM

## 2024-08-06 DIAGNOSIS — N952 Postmenopausal atrophic vaginitis: Secondary | ICD-10-CM | POA: Diagnosis not present

## 2024-08-06 NOTE — Progress Notes (Signed)
 Complete physical exam   Patient: Emily Roth   DOB: 09/27/1960   63 y.o. Female  MRN: [id number] Visit Date: 08/06/2024  Today's healthcare provider: Rockie Agent, MD   Chief Complaint  Patient presents with   Annual Exam    CPE/ no other concerns   Subjective    Emily Roth is a 63 y.o. female who presents today for a complete physical exam.    She does not have additional problems to discuss today.   Discussed the use of AI scribe software for clinical note transcription with the patient, who gave verbal consent to proceed.  History of Present Illness Emily Roth is a 63 year old female who presents for an annual physical exam.  She experiences urinary urgency, describing a sudden need to urinate upon reaching the bathroom. There is no hematuria or rectal bleeding.  She maintains an active lifestyle through her work, which involves being on her feet most of the time at her two jobs at [employer] and a hospital in [place]. She follows a general diet.  She is no longer on prescription vitamin D  but takes a calcium plus vitamin D  tablet. She received her flu shot on October 31st at her workplace, has previously received the shingles vaccine at a pharmacy, and has had one COVID vaccine.  She has a history of cutting the tip of her finger off and another finger injury from a car door, but these do not currently cause her issues as the nails still grow.     Past Medical History:  Diagnosis Date   Premature ovarian failure 2005   age 83   Past Surgical History:  Procedure Laterality Date   COLONOSCOPY WITH PROPOFOL  N/A 11/08/2017   Procedure: COLONOSCOPY WITH PROPOFOL ;  Surgeon: Janalyn Keene NOVAK, MD;  Location: [facility] ENDOSCOPY;  Service: Endoscopy;  Laterality: N/A;   WISDOM TOOTH EXTRACTION  age 62   Social History   Socioeconomic History   Marital status: Single    Spouse name: Not on file   Number of children: 1   Years of education:  Not on file   Highest education level: Not on file  Occupational History   Not on file  Tobacco Use   Smoking status: Never   Smokeless tobacco: Never  Vaping Use   Vaping status: Never Used  Substance and Sexual Activity   Alcohol use: No    Comment: rare   Drug use: No   Sexual activity: Not Currently    Partners: Male    Birth control/protection: Post-menopausal  Other Topics Concern   Not on file  Social History Narrative   She works in Engineering Ge[employer].  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   Social Drivers of Corporate Investment Banker Strain: Not on file  Food Insecurity: Not on file  Transportation Needs: Not on file  Physical Activity: Not on file  Stress: Not on file  Social Connections: Not on file  Intimate Partner Violence: Not on file   Family Status  Relation Name Status   Sister  Deceased at age late 57       [place]   Brother  Deceased at age 89       [place]   Brother  Deceased at age late 32       [place] Syndrome   Mother  Alive   Father  Deceased  complications of pneumonia and CVA   Mat Aunt  Deceased   Mat Uncle  Deceased at age 16       cancer   Other Nephew Tyson Deceased at age 80       [place] Syndrome   Other Texie Coy Deceased at age 31       [place] Syndrome   Other Nephew Caron Deceased at age about 39       [place] Syndrome   Mat Uncle  Deceased   Neg Hx  (Not Specified)  No partnership data on file   Family History  Problem Relation Age of Onset   Other Sister        [place] Syndrome   Other Brother        [place] Syndrome   Other Brother        [place] Syndrome   Other Other        [place] Syndrome   Other Other        [place] Syndrome   Other Other        nephew - [place]   Cancer Maternal Uncle        ?   Breast cancer Neg Hx    No Known Allergies   Medications: Outpatient Medications Prior to Visit   Medication Sig   aspirin 81 MG tablet    CALCIUM PO Take by mouth.   Clobetasol Propionate Emulsion 0.05 % topical foam APPLY TO THE AFFECTED AREA EVERY NIGHT AT BEDTIME   hydrOXYzine  (ATARAX ) 10 MG tablet Take 1 tablet (10 mg total) by mouth at bedtime.   meloxicam  (MOBIC ) 7.5 MG tablet Take 1 tablet (7.5 mg total) by mouth daily.   Multiple Vitamin (MULTIVITAMIN) tablet Take 1 tablet by mouth daily.   Multiple Vitamins-Minerals (HAIR/SKIN/NAILS PO) Take by mouth.   Omega-3 Fatty Acids (FISH OIL PO) Take by mouth.   [DISCONTINUED] Vitamin D , Ergocalciferol , (DRISDOL ) 1.25 MG (50000 UT) CAPS capsule TAKE 1 CAPSULE BY MOUTH ONCE EVERY MONTH   No facility-administered medications prior to visit.    Review of Systems  Last CBC Lab Results  Component Value Date   WBC 6.4 06/20/2023   HGB 12.9 06/20/2023   HCT 40.5 06/20/2023   MCV 90 06/20/2023   MCH 28.6 06/20/2023   RDW 13.0 06/20/2023   PLT 246 06/20/2023   Last metabolic panel Lab Results  Component Value Date   GLUCOSE 75 06/20/2023   NA 140 06/20/2023   K 4.6 06/20/2023   CL 104 06/20/2023   CO2 23 06/20/2023   BUN 17 06/20/2023   CREATININE 0.99 06/20/2023   EGFR 65 06/20/2023   CALCIUM 9.6 06/20/2023   PROT 6.7 06/20/2023   ALBUMIN 3.9 06/20/2023   LABGLOB 2.8 06/20/2023   AGRATIO 2.0 07/31/2022   BILITOT 0.4 06/20/2023   ALKPHOS 76 06/20/2023   AST 21 06/20/2023   ALT 18 06/20/2023   Last lipids Lab Results  Component Value Date   CHOL 181 08/06/2023   HDL 80 08/06/2023   LDLCALC 92 08/06/2023   TRIG 43 08/06/2023   CHOLHDL 2.3 08/06/2023   Last hemoglobin A1c Lab Results  Component Value Date   HGBA1C 5.4 06/20/2023   Last thyroid  functions Lab Results  Component Value Date   TSH 1.200 06/20/2023   FREET4 0.94 06/20/2023   Last vitamin D  Lab Results  Component Value Date   VD25OH 73.3 06/20/2023   Last vitamin B12 and Folate  No results found for: VITAMINB12, FOLATE     Objective     BP 112/69 (BP Location: Right Arm, Patient Position: Sitting, Cuff Size: Normal)   Resp 16   Ht 5' 3 (1.6 m)   Wt 163 lb (73.9 kg)   LMP 01/10/2004 (Approximate)   SpO2 98%   BMI 28.87 kg/m  BP Readings from Last 3 Encounters:  08/06/24 112/69  08/06/23 120/86  06/20/23 131/88   Wt Readings from Last 3 Encounters:  08/06/24 163 lb (73.9 kg)  08/06/23 158 lb 3.2 oz (71.8 kg)  06/20/23 158 lb 3.2 oz (71.8 kg)        Physical Exam Vitals reviewed.  Constitutional:      General: She is not in acute distress.    Appearance: Normal appearance. She is not ill-appearing, toxic-appearing or diaphoretic.  HENT:     Head: Normocephalic and atraumatic.     Right Ear: Tympanic membrane and external ear normal. There is no impacted cerumen.     Left Ear: Tympanic membrane and external ear normal. There is no impacted cerumen.     Nose: Nose normal.     Mouth/Throat:     Pharynx: Oropharynx is clear.  Eyes:     General: No scleral icterus.    Extraocular Movements: Extraocular movements intact.     Conjunctiva/sclera: Conjunctivae normal.     Pupils: Pupils are equal, round, and reactive to light.  Cardiovascular:     Rate and Rhythm: Normal rate and regular rhythm.     Pulses: Normal pulses.     Heart sounds: Normal heart sounds. No murmur heard.    No friction rub. No gallop.  Pulmonary:     Effort: Pulmonary effort is normal. No respiratory distress.     Breath sounds: Normal breath sounds. No wheezing, rhonchi or rales.  Abdominal:     General: Bowel sounds are normal. There is no distension.     Palpations: Abdomen is soft. There is no mass.     Tenderness: There is no abdominal tenderness. There is no guarding.  Musculoskeletal:        General: No deformity.     Cervical back: Normal range of motion and neck supple.     Right lower leg: No edema.     Left lower leg: No edema.  Lymphadenopathy:     Cervical: No cervical adenopathy.  Skin:    General: Skin is  warm.     Capillary Refill: Capillary refill takes less than 2 seconds.     Findings: No erythema or rash.     Comments: Hyperpigmented nails on bilateral hands  Neurological:     General: No focal deficit present.     Mental Status: She is alert and oriented to person, place, and time.     Cranial Nerves: Cranial nerves 2-12 are intact. No cranial nerve deficit or facial asymmetry.     Motor: Motor function is intact. No weakness.     Gait: Gait normal.  Psychiatric:        Mood and Affect: Mood normal.        Behavior: Behavior normal.       Last depression screening scores    08/06/2024    3:00 PM 08/06/2023    8:09 AM 06/20/2023    2:30 PM  PHQ 2/9 Scores  PHQ - 2 Score 0 0 0  PHQ- 9 Score 3 0       Data saved with a previous flowsheet row definition  Last fall risk screening    08/06/2024    2:59 PM  Fall Risk   Falls in the past year? 0  Injury with Fall? 0    Last Audit-C alcohol use screening    07/31/2022   11:12 AM  Alcohol Use Disorder Test (AUDIT)  1. How often do you have a drink containing alcohol? 1  2. How many drinks containing alcohol do you have on a typical day when you are drinking? 0  3. How often do you have six or more drinks on one occasion? 0  AUDIT-C Score 1   A score of 3 or more in women, and 4 or more in men indicates increased risk for alcohol abuse, EXCEPT if all of the points are from question 1   No results found for any visits on 08/06/24.  Assessment & Plan    Routine Health Maintenance and Physical Exam  Immunization History  Administered Date(s) Administered   Hepatitis B 09/11/2013, 10/12/2013, 03/11/2014   Hepatitis B, ADULT 10/24/2013, 02/20/2014   Influenza,inj,Quad PF,6+ Mos 06/15/2022   Influenza-Unspecified 07/04/2021, 07/11/2024   Tdap 05/09/2013   Typhoid Inactivated 03/11/2014    Health Maintenance  Topic Date Due   DTaP/Tdap/Td (2 - Td or Tdap) 05/10/2023   Mammogram  09/06/2024   COVID-19 Vaccine  (4 - 2025-26 season) 08/22/2024 (Originally 05/12/2024)   Pneumococcal Vaccine: 50+ Years (1 of 1 - PCV) 08/06/2025 (Originally 06/21/2011)   Colonoscopy  11/09/2027   Cervical Cancer Screening (HPV/Pap Cotest)  08/05/2028   Influenza Vaccine  Completed   Hepatitis B Vaccines 19-59 Average Risk  Completed   Hepatitis C Screening  Completed   HIV Screening  Completed   Zoster Vaccines- Shingrix  Completed   HPV VACCINES  Aged Out   Meningococcal B Vaccine  Aged Out    Problem List Items Addressed This Visit     Annual physical exam - Primary   Avitaminosis D   Relevant Orders   VITAMIN D  25 Hydroxy (Vit-D Deficiency, Fractures)   Big thyroid    Relevant Orders   TSH + free T4   Postmenopausal atrophic vaginitis   Other Visit Diagnoses       Screening for lipid disorders       Relevant Orders   Lipid panel     Screening for diabetes mellitus       Relevant Orders   Hemoglobin A1c     Screening for deficiency anemia       Relevant Orders   CBC     Overweight with body mass index (BMI) of 28 to 28.9 in adult       Relevant Orders   CMP14+EGFR     Encounter for screening mammogram for malignant neoplasm of breast       Relevant Orders   MM 3D SCREENING MAMMOGRAM BILATERAL BREAST       Assessment and Plan Assessment & Plan Adult Wellness Visit Annual physical examination conducted. Discussed general health maintenance and preventive care. - Ordered TSH, A1c, CMP, CBC, lipid panel, and vitamin D  tests - Ordered mammogram for breast cancer screening - Recommended tetanus booster - Recommended influenza vaccine - Recommended COVID vaccine at pharmacy - Recommended Shingrix vaccine - Recommended pneumococcal vaccine  Vitamin D  deficiency Currently taking calcium plus vitamin D  tablet. No prescription vitamin D  from dermatologist.  Overweight Discussed general diet and physical activity. Engages in physical activity through work.  Screening for lipoid disorders,  diabetes mellitus, diseases of the blood and blood-forming organs,  and breast cancer Screening tests ordered as part of routine health maintenance. - Ordered TSH, A1c, CMP, CBC, lipid panel, and vitamin D  tests - Ordered mammogram for breast cancer screening       Return in about 1 year (around 08/06/2025) for CPE.       Rockie Agent, MD  [facility] [phone] (phone) [phone] (fax)  [facility]

## 2024-08-06 NOTE — Patient Instructions (Signed)
 To keep you healthy, please keep in mind the following health maintenance items that you are due for:   Health Maintenance Due  Topic Date Due   COVID-19 Vaccine (1) Never done   Zoster Vaccines- Shingrix (1 of 2) Never done   Pneumococcal Vaccine: 50+ Years (1 of 1 - PCV) Never done   DTaP/Tdap/Td (2 - Td or Tdap) 05/10/2023   Influenza Vaccine  04/11/2024   Mammogram  09/06/2024     Best Wishes,   Dr. Lang

## 2024-08-07 LAB — CMP14+EGFR
ALT: 19 IU/L (ref 0–32)
AST: 26 IU/L (ref 0–40)
Albumin: 4.4 g/dL (ref 3.9–4.9)
Alkaline Phosphatase: 83 IU/L (ref 49–135)
BUN/Creatinine Ratio: 23 (ref 12–28)
BUN: 23 mg/dL (ref 8–27)
Bilirubin Total: 0.4 mg/dL (ref 0.0–1.2)
CO2: 21 mmol/L (ref 20–29)
Calcium: 9.4 mg/dL (ref 8.7–10.3)
Chloride: 105 mmol/L (ref 96–106)
Creatinine, Ser: 0.99 mg/dL (ref 0.57–1.00)
Globulin, Total: 2.6 g/dL (ref 1.5–4.5)
Glucose: 87 mg/dL (ref 70–99)
Potassium: 5 mmol/L (ref 3.5–5.2)
Sodium: 140 mmol/L (ref 134–144)
Total Protein: 7 g/dL (ref 6.0–8.5)
eGFR: 64 mL/min/1.73 (ref >59)

## 2024-08-07 LAB — CBC
Hematocrit: 40.3 % (ref 34.0–46.6)
Hemoglobin: 12.9 g/dL (ref 11.1–15.9)
MCH: 27.8 pg (ref 26.6–33.0)
MCHC: 32 g/dL (ref 31.5–35.7)
MCV: 87 fL (ref 79–97)
Platelets: 257 x10E3/uL (ref 150–450)
RBC: 4.64 x10E6/uL (ref 3.77–5.28)
RDW: 12.5 % (ref 11.7–15.4)
WBC: 7.2 x10E3/uL (ref 3.4–10.8)

## 2024-08-07 LAB — LIPID PANEL
Chol/HDL Ratio: 2 ratio (ref 0.0–4.4)
Cholesterol, Total: 182 mg/dL (ref 100–199)
HDL: 89 mg/dL (ref >39)
LDL Chol Calc (NIH): 80 mg/dL (ref 0–99)
Triglycerides: 71 mg/dL (ref 0–149)
VLDL Cholesterol Cal: 13 mg/dL (ref 5–40)

## 2024-08-07 LAB — HEMOGLOBIN A1C
Est. average glucose Bld gHb Est-mCnc: 100 mg/dL
Hgb A1c MFr Bld: 5.1 % (ref 4.8–5.6)

## 2024-08-07 LAB — VITAMIN D 25 HYDROXY (VIT D DEFICIENCY, FRACTURES): Vit D, 25-Hydroxy: 37.4 ng/mL (ref 30.0–100.0)

## 2024-08-07 LAB — TSH+FREE T4
Free T4: 0.9 ng/dL (ref 0.82–1.77)
TSH: 1.27 u[IU]/mL (ref 0.450–4.500)

## 2024-08-12 ENCOUNTER — Ambulatory Visit: Payer: Self-pay | Admitting: Family Medicine

## 2024-09-10 ENCOUNTER — Ambulatory Visit
Admission: RE | Admit: 2024-09-10 | Discharge: 2024-09-10 | Disposition: A | Discharge status: Home / Self Care | Source: Ambulatory Visit | Attending: Family Medicine | Admitting: Family Medicine

## 2024-09-10 DIAGNOSIS — Z1231 Encounter for screening mammogram for malignant neoplasm of breast: Secondary | ICD-10-CM | POA: Diagnosis present
